# Patient Record
Sex: Female | Born: 1935 | Race: White | Hispanic: No | Marital: Married
Health system: Southern US, Community
[De-identification: ages and names within clinical notes are randomized; demographics above are authoritative.]

---

## 2005-02-27 ENCOUNTER — Ambulatory Visit: Payer: Self-pay | Admitting: Internal Medicine

## 2005-08-17 ENCOUNTER — Emergency Department: Payer: Self-pay | Admitting: Emergency Medicine

## 2005-08-21 ENCOUNTER — Ambulatory Visit: Payer: Self-pay | Admitting: Internal Medicine

## 2006-05-09 ENCOUNTER — Ambulatory Visit: Payer: Self-pay | Admitting: Internal Medicine

## 2007-07-24 ENCOUNTER — Ambulatory Visit: Payer: Self-pay | Admitting: Internal Medicine

## 2008-02-03 ENCOUNTER — Ambulatory Visit: Payer: Self-pay | Admitting: Gastroenterology

## 2008-07-27 ENCOUNTER — Ambulatory Visit: Payer: Self-pay | Admitting: Internal Medicine

## 2009-08-09 ENCOUNTER — Ambulatory Visit: Payer: Self-pay | Admitting: Internal Medicine

## 2010-08-22 ENCOUNTER — Ambulatory Visit: Payer: Self-pay | Admitting: Internal Medicine

## 2010-11-22 ENCOUNTER — Emergency Department: Payer: Self-pay | Admitting: Emergency Medicine

## 2011-07-23 ENCOUNTER — Emergency Department: Payer: Self-pay | Admitting: *Deleted

## 2011-07-23 LAB — CBC
HCT: 28.7 % — ABNORMAL LOW (ref 35.0–47.0)
Platelet: 354 10*3/uL (ref 150–440)
RDW: 13.9 % (ref 11.5–14.5)
WBC: 8.1 10*3/uL (ref 3.6–11.0)

## 2011-07-23 LAB — COMPREHENSIVE METABOLIC PANEL
Albumin: 3.5 g/dL (ref 3.4–5.0)
Anion Gap: 13 (ref 7–16)
BUN: 19 mg/dL — ABNORMAL HIGH (ref 7–18)
Bilirubin,Total: 0.5 mg/dL (ref 0.2–1.0)
Chloride: 100 mmol/L (ref 98–107)
Creatinine: 0.78 mg/dL (ref 0.60–1.30)
Osmolality: 283 (ref 275–301)
Potassium: 4.1 mmol/L (ref 3.5–5.1)
SGOT(AST): 33 U/L (ref 15–37)
Sodium: 141 mmol/L (ref 136–145)
Total Protein: 7.2 g/dL (ref 6.4–8.2)

## 2011-07-23 LAB — PROTIME-INR: INR: 0.9

## 2011-11-26 ENCOUNTER — Encounter: Payer: Self-pay | Admitting: Neurology

## 2013-01-07 ENCOUNTER — Emergency Department: Payer: Self-pay | Admitting: Emergency Medicine

## 2013-01-19 ENCOUNTER — Emergency Department: Payer: Self-pay | Admitting: Emergency Medicine

## 2013-02-13 ENCOUNTER — Inpatient Hospital Stay: Payer: Self-pay | Admitting: Internal Medicine

## 2013-02-13 LAB — COMPREHENSIVE METABOLIC PANEL
Albumin: 3.3 g/dL — ABNORMAL LOW (ref 3.4–5.0)
Alkaline Phosphatase: 136 U/L (ref 50–136)
Anion Gap: 5 — ABNORMAL LOW (ref 7–16)
BUN: 24 mg/dL — ABNORMAL HIGH (ref 7–18)
Chloride: 104 mmol/L (ref 98–107)
Creatinine: 0.79 mg/dL (ref 0.60–1.30)
EGFR (Non-African Amer.): >60
Osmolality: 283 (ref 275–301)
SGOT(AST): 24 U/L (ref 15–37)
SGPT (ALT): 12 U/L (ref 12–78)
Total Protein: 6.6 g/dL (ref 6.4–8.2)

## 2013-02-13 LAB — URINALYSIS, COMPLETE
Bacteria: NONE SEEN
Bilirubin,UR: NEGATIVE
Glucose,UR: 50 mg/dL (ref 0–75)
Leukocyte Esterase: NEGATIVE
Nitrite: NEGATIVE
Ph: 5 (ref 4.5–8.0)
Specific Gravity: 1.023 (ref 1.003–1.030)
Squamous Epithelial: NONE SEEN

## 2013-02-13 LAB — TROPONIN I: Troponin-I: <0.02 ng/mL

## 2013-02-13 LAB — CK TOTAL AND CKMB (NOT AT ARMC)
CK, Total: 160 U/L (ref 21–215)
CK-MB: 3.4 ng/mL (ref 0.5–3.6)

## 2013-02-13 LAB — CBC
MCH: 31.3 pg (ref 26.0–34.0)
MCV: 92 fL (ref 80–100)
Platelet: 240 10*3/uL (ref 150–440)
RDW: 14.2 % (ref 11.5–14.5)
WBC: 12.4 10*3/uL — ABNORMAL HIGH (ref 3.6–11.0)

## 2013-02-14 LAB — CBC WITH DIFFERENTIAL/PLATELET
Basophil #: 0 10*3/uL (ref 0.0–0.1)
Eosinophil %: 1.1 %
HCT: 26.4 % — ABNORMAL LOW (ref 35.0–47.0)
Lymphocyte #: 1.2 10*3/uL (ref 1.0–3.6)
MCH: 31.5 pg (ref 26.0–34.0)
MCHC: 34.7 g/dL (ref 32.0–36.0)
MCV: 91 fL (ref 80–100)
Neutrophil %: 76.8 %
RBC: 2.91 10*6/uL — ABNORMAL LOW (ref 3.80–5.20)
WBC: 10 10*3/uL (ref 3.6–11.0)

## 2013-02-14 LAB — BASIC METABOLIC PANEL
Anion Gap: 5 — ABNORMAL LOW (ref 7–16)
Calcium, Total: 8.5 mg/dL (ref 8.5–10.1)
Chloride: 105 mmol/L (ref 98–107)
Co2: 29 mmol/L (ref 21–32)
Potassium: 3.7 mmol/L (ref 3.5–5.1)
Sodium: 139 mmol/L (ref 136–145)

## 2013-02-15 LAB — BASIC METABOLIC PANEL
Calcium, Total: 7.9 mg/dL — ABNORMAL LOW (ref 8.5–10.1)
Co2: 28 mmol/L (ref 21–32)
EGFR (African American): >60
Glucose: 123 mg/dL — ABNORMAL HIGH (ref 65–99)
Potassium: 4.1 mmol/L (ref 3.5–5.1)
Sodium: 142 mmol/L (ref 136–145)

## 2013-02-15 LAB — CBC WITH DIFFERENTIAL/PLATELET
Basophil %: 0.4 %
Eosinophil #: 0.2 10*3/uL (ref 0.0–0.7)
Eosinophil %: 1.7 %
Lymphocyte %: 11.1 %
MCH: 31.8 pg (ref 26.0–34.0)
MCV: 90 fL (ref 80–100)
Neutrophil #: 6.9 10*3/uL — ABNORMAL HIGH (ref 1.4–6.5)
Neutrophil %: 77 %
Platelet: 159 10*3/uL (ref 150–440)
RBC: 2.35 10*6/uL — ABNORMAL LOW (ref 3.80–5.20)

## 2013-02-16 LAB — BASIC METABOLIC PANEL
Anion Gap: 5 — ABNORMAL LOW (ref 7–16)
BUN: 7 mg/dL (ref 7–18)
Calcium, Total: 8.8 mg/dL (ref 8.5–10.1)
Chloride: 107 mmol/L (ref 98–107)
Chloride: 106 mmol/L (ref 98–107)
Co2: 29 mmol/L (ref 21–32)
Creatinine: 0.51 mg/dL — ABNORMAL LOW (ref 0.60–1.30)
EGFR (African American): >60
EGFR (Non-African Amer.): >60
EGFR (Non-African Amer.): >60
Glucose: 118 mg/dL — ABNORMAL HIGH (ref 65–99)
Glucose: 177 mg/dL — ABNORMAL HIGH (ref 65–99)
Osmolality: 278 (ref 275–301)
Potassium: 3.9 mmol/L (ref 3.5–5.1)
Sodium: 140 mmol/L (ref 136–145)

## 2013-02-16 LAB — CBC WITH DIFFERENTIAL/PLATELET
Basophil %: 0.3 %
Eosinophil #: 0.1 10*3/uL (ref 0.0–0.7)
Eosinophil %: 1.3 %
HCT: 23.8 % — ABNORMAL LOW (ref 35.0–47.0)
Lymphocyte %: 10.7 %
MCHC: 34.4 g/dL (ref 32.0–36.0)
MCV: 91 fL (ref 80–100)
Monocyte #: 0.7 x10 3/mm (ref 0.2–0.9)
Monocyte %: 8.7 %
Neutrophil %: 79 %
Platelet: 173 10*3/uL (ref 150–440)
RBC: 2.62 10*6/uL — ABNORMAL LOW (ref 3.80–5.20)
WBC: 7.8 10*3/uL (ref 3.6–11.0)

## 2013-02-16 LAB — HEMOGLOBIN: HGB: 9.4 g/dL — ABNORMAL LOW (ref 12.0–16.0)

## 2013-02-17 LAB — CBC WITH DIFFERENTIAL/PLATELET
Eosinophil #: 0.2 10*3/uL (ref 0.0–0.7)
Lymphocyte %: 12.7 %
MCHC: 34.1 g/dL (ref 32.0–36.0)
MCV: 91 fL (ref 80–100)
Neutrophil #: 5.6 10*3/uL (ref 1.4–6.5)
Neutrophil %: 77 %
Platelet: 203 10*3/uL (ref 150–440)
RBC: 2.53 10*6/uL — ABNORMAL LOW (ref 3.80–5.20)
WBC: 7.2 10*3/uL (ref 3.6–11.0)

## 2013-02-18 LAB — BASIC METABOLIC PANEL
Calcium, Total: 8.5 mg/dL (ref 8.5–10.1)
Co2: 27 mmol/L (ref 21–32)
Glucose: 122 mg/dL — ABNORMAL HIGH (ref 65–99)
Osmolality: 279 (ref 275–301)
Potassium: 4.2 mmol/L (ref 3.5–5.1)
Sodium: 140 mmol/L (ref 136–145)

## 2013-02-18 LAB — CBC WITH DIFFERENTIAL/PLATELET
Basophil #: 0 10*3/uL (ref 0.0–0.1)
Eosinophil #: 0.2 10*3/uL (ref 0.0–0.7)
Eosinophil %: 3.1 %
HCT: 28.2 % — ABNORMAL LOW (ref 35.0–47.0)
HGB: 9.8 g/dL — ABNORMAL LOW (ref 12.0–16.0)
Lymphocyte #: 0.9 10*3/uL — ABNORMAL LOW (ref 1.0–3.6)
Lymphocyte %: 13.9 %
Monocyte %: 10 %
Neutrophil #: 4.9 10*3/uL (ref 1.4–6.5)
Neutrophil %: 72.6 %
RBC: 3.13 10*6/uL — ABNORMAL LOW (ref 3.80–5.20)

## 2014-02-23 IMAGING — CT CT OF THE RIGHT HIP WITHOUT CONTRAST
1 of 2 series · 14 of 32 positions shown, 20 images · non-contrast
Comparison: none

REASON FOR EXAM: evaluate hip fracture
COMMENTS:

PROCEDURE:     CT  - CT HIP RIGHT WITHOUT CONTRAST  - February 13, 2013  [DATE]
RESULT:
TECHNIQUE: Multiplanar imaging of the right hip was obtained utilizing
helical 3 mm acquisition and bone reconstruction algorithm.

[Series 2: hip 3.0 b70s · axial · 0.35mm/px · z∈[-618,-471]mm · 14 of 57 slices shown, 20 images]
[im 4/57  soft-tissue]
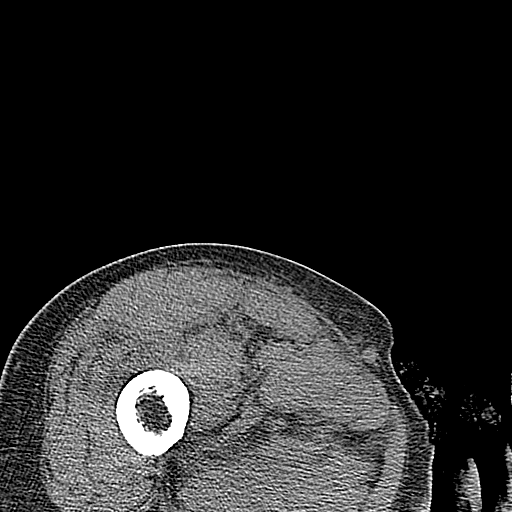
[im 4/57  bone]
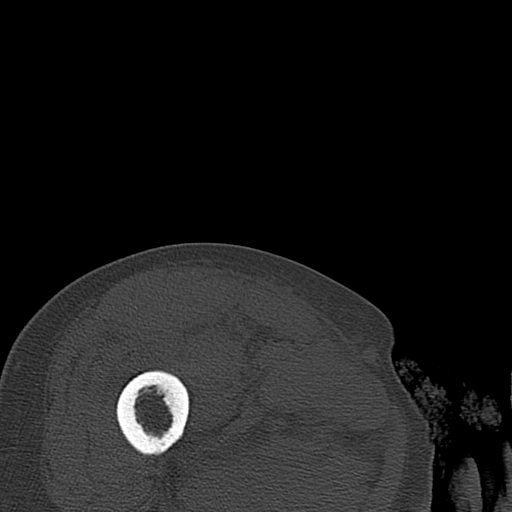
[im 8/57  soft-tissue]
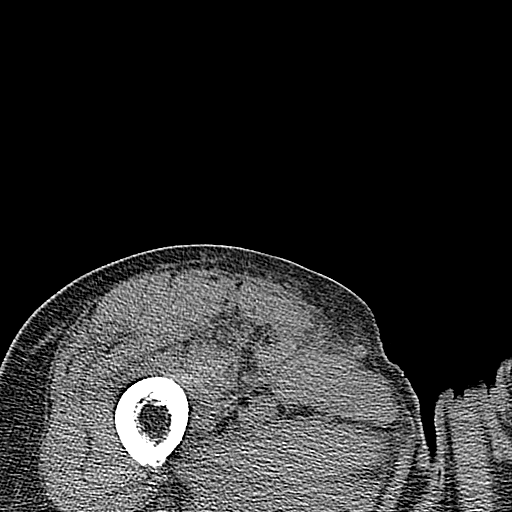
[im 12/57  soft-tissue]
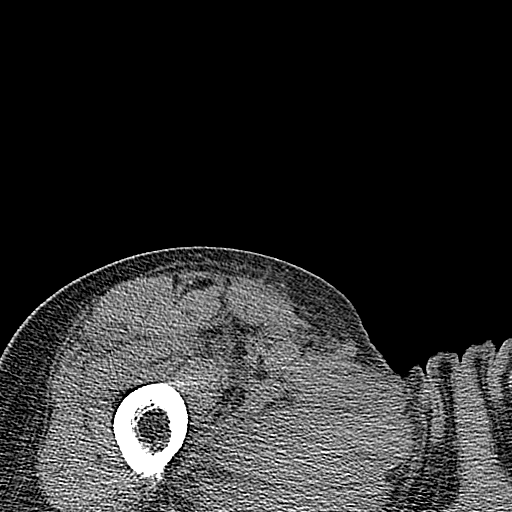
[im 15/57  soft-tissue]
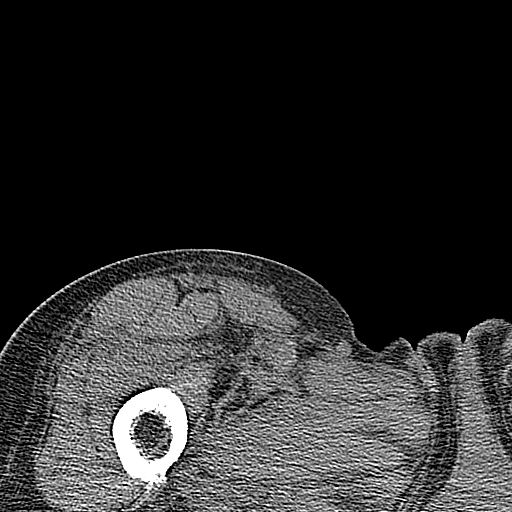
[im 19/57  soft-tissue]
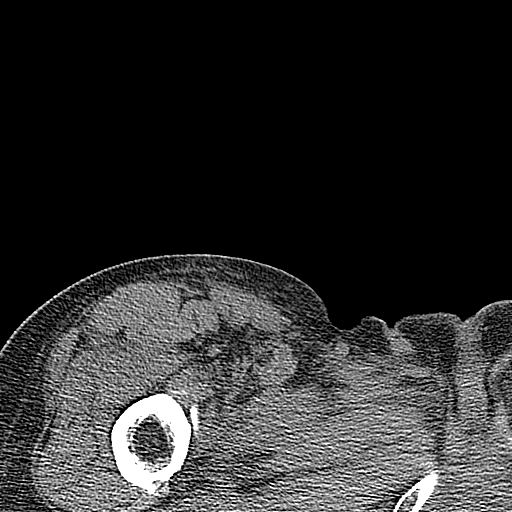
[im 23/57  soft-tissue]
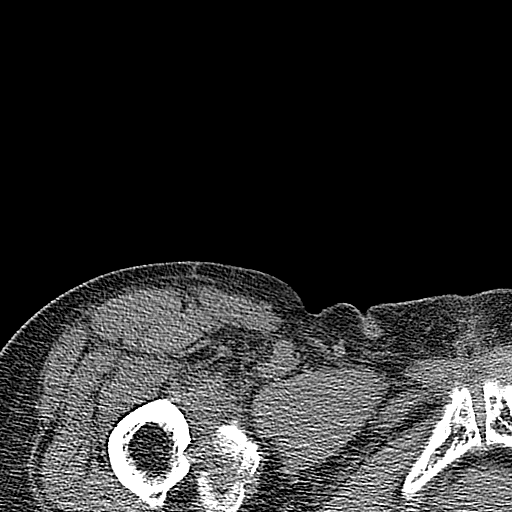
[im 27/57  soft-tissue]
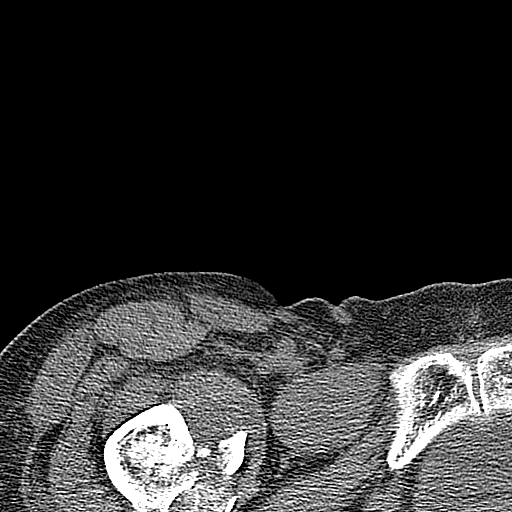
[im 30/57  soft-tissue]
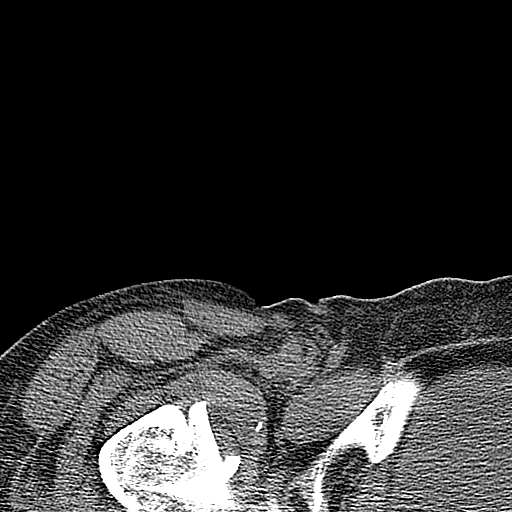
[im 34/57  soft-tissue]
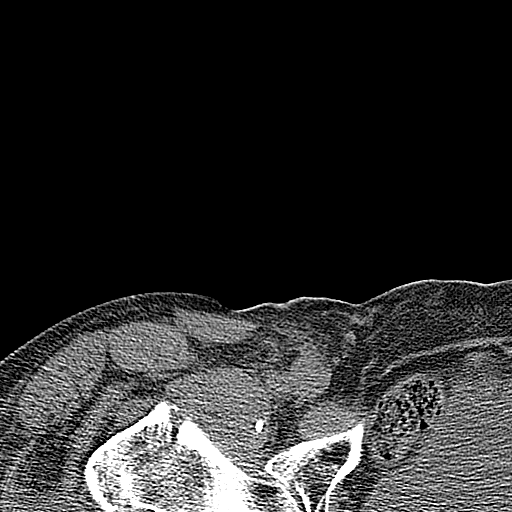
[im 34/57  bone]
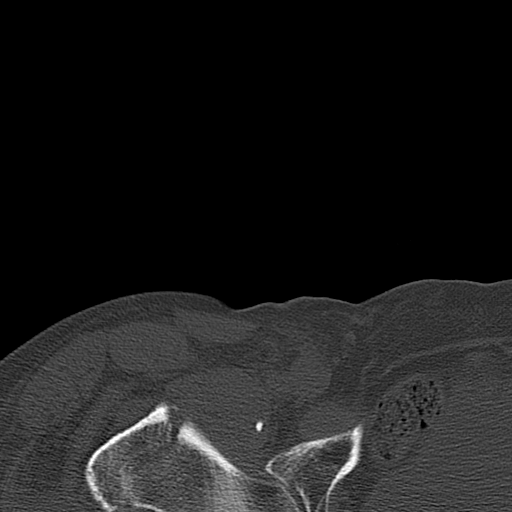
[im 38/57  soft-tissue]
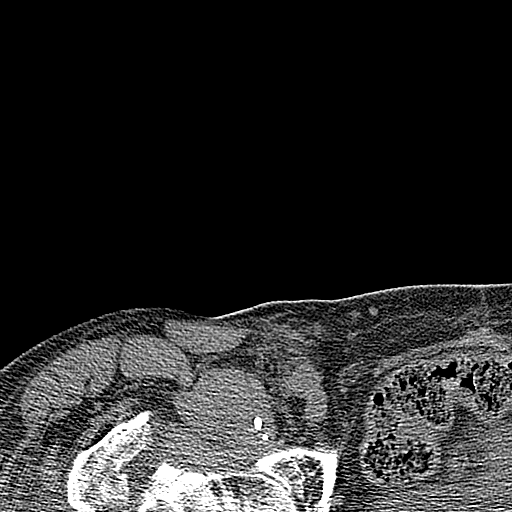
[im 42/57  soft-tissue]
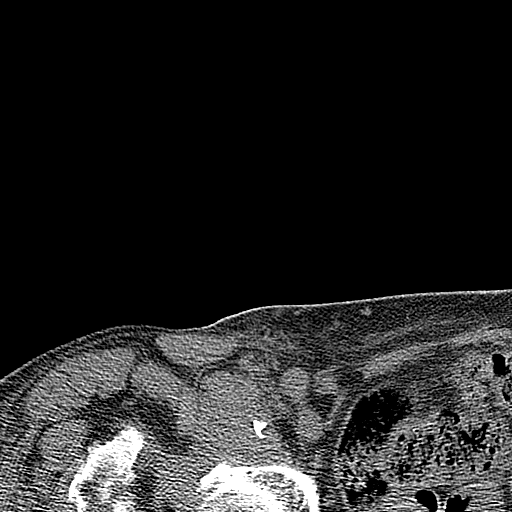
[im 42/57  lung]
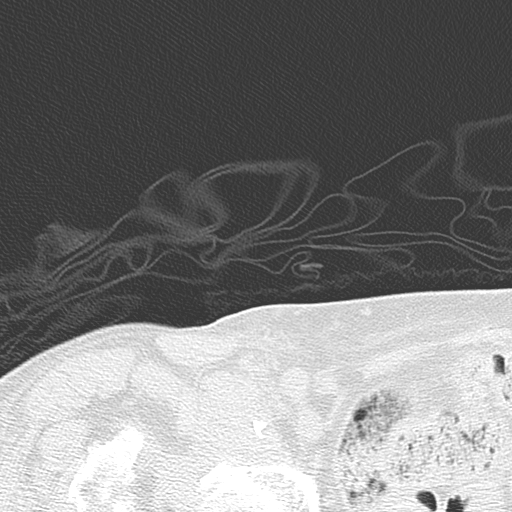
[im 45/57  soft-tissue]
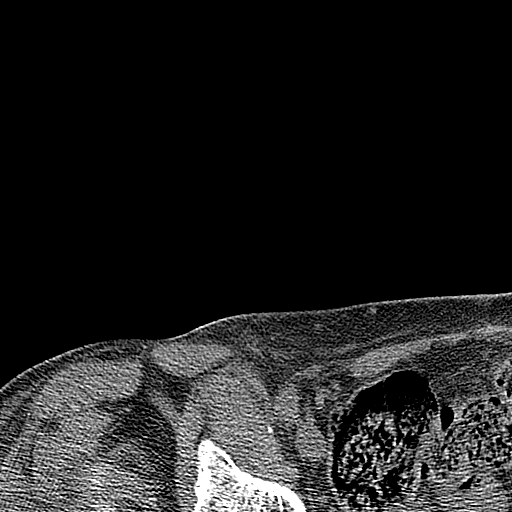
[im 45/57  lung]
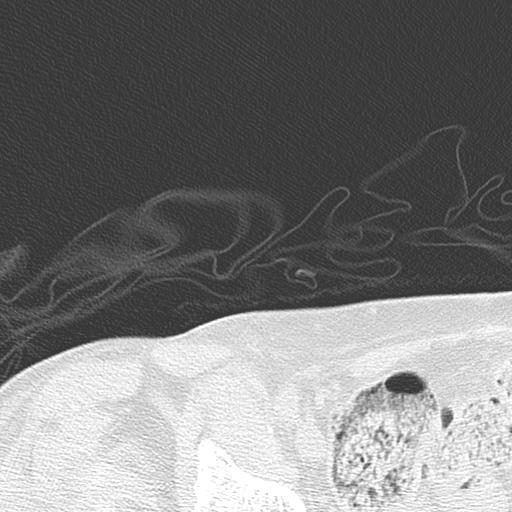
[im 49/57  soft-tissue]
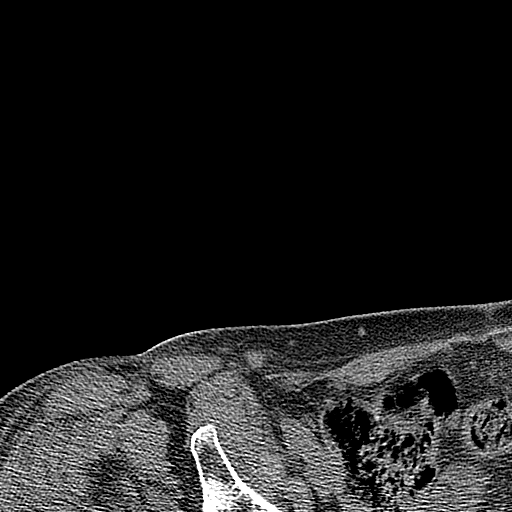
[im 49/57  lung]
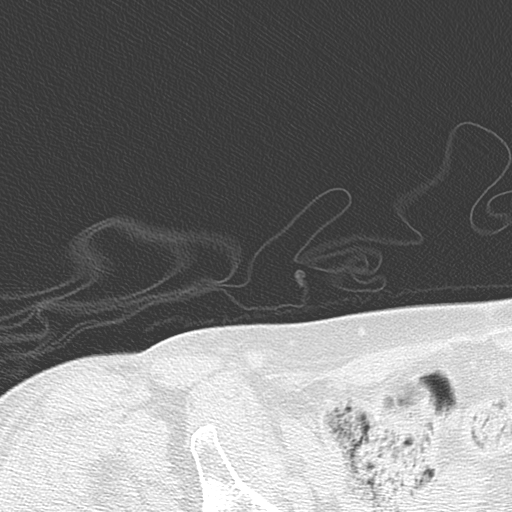
[im 53/57  soft-tissue]
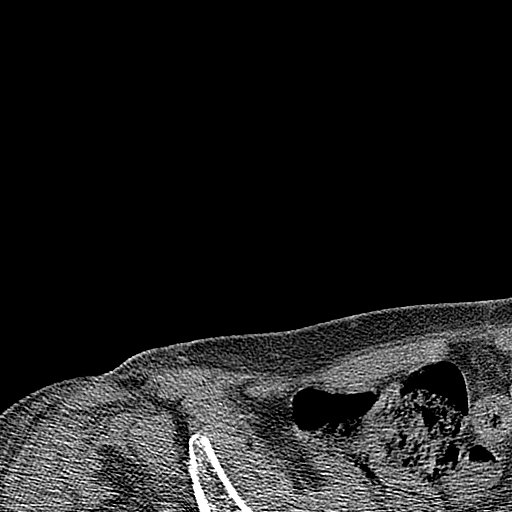
[im 53/57  lung]
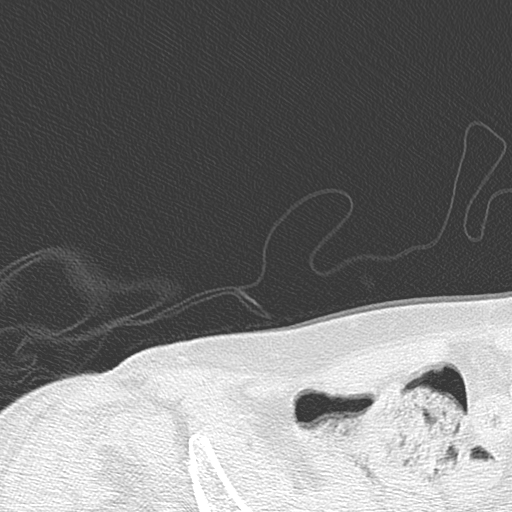

[14 of 32 positions shown; findings below may reference images not displayed]

FINDINGS: A comminuted fracture is identified along the base of the femoral
neck with extension into the intertrochanteric region and a concomitant,
comminuted intertrochanteric fracture. There is superior displacement and
impaction of the fracture. The femoral head is located.

A small hematoma is identified about the joint. Small butterfly fragments
project just anterior to the anterior/superior aspect of the acetabulum.
IMPRESSION: Comminuted right hip fracture involving the base of the
femoral neck and intertrochanteric region.

## 2014-02-23 IMAGING — CR RIGHT HIP - COMPLETE 2+ VIEW
1 series · 2 of 2 positions shown · non-contrast
Comparison: none

REASON FOR EXAM: mobile xray showed right hip fx
COMMENTS:

PROCEDURE:     DXR - DXR HIP RIGHT COMPLETE  - February 13, 2013  [DATE]
RESULT:

[Series 1: t hip ap right · 0.14mm/px · 2 of 2 slices shown]
[im 1/2]
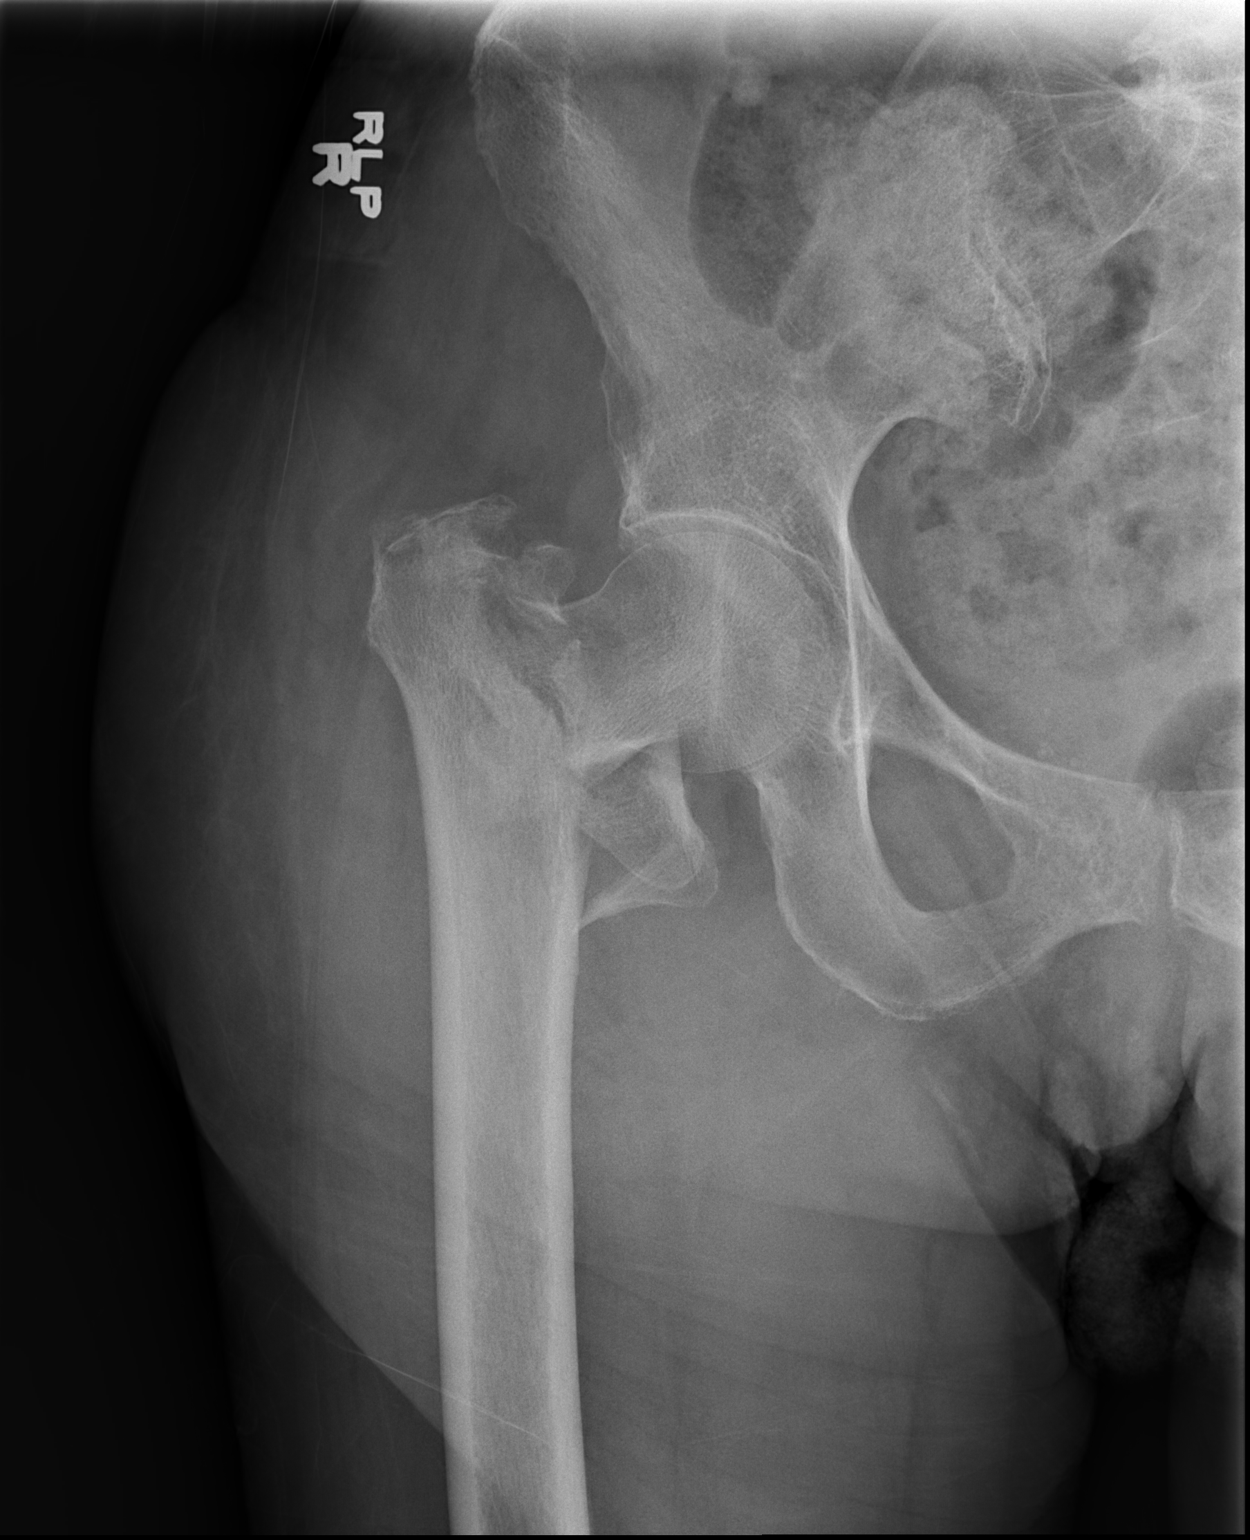
[im 2/2]
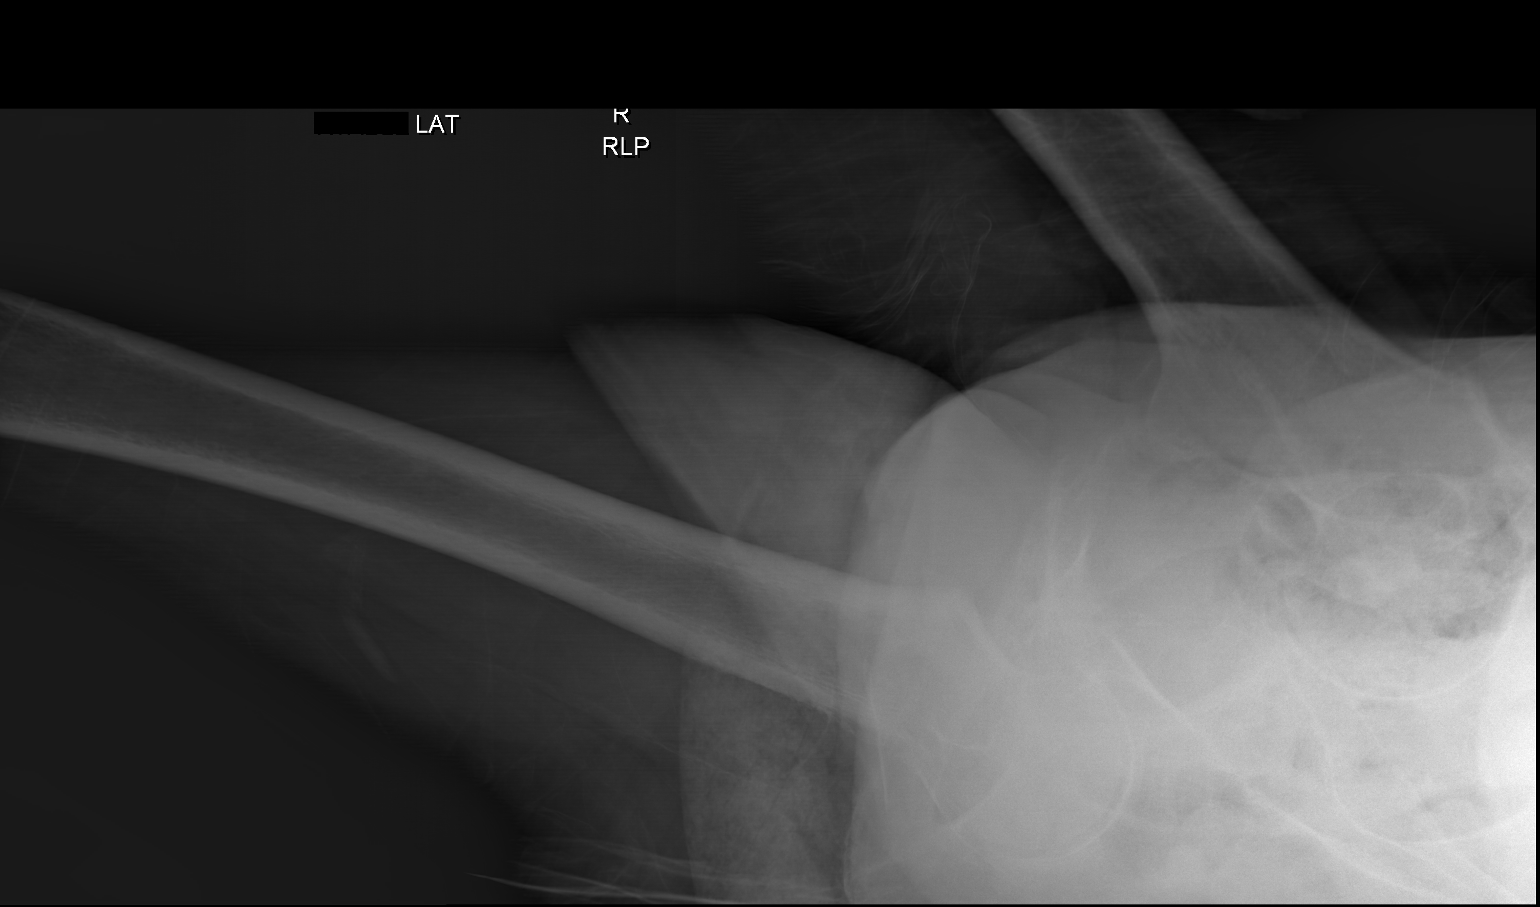

[2 of 2 positions shown; findings below may reference images not displayed]

FINDINGS: An impacted comminuted fracture is identified along the base of
the femoral neck. There is extension into the intertrochanteric region with
a concomitant, comminuted intertrochanteric component. The femoral head is
located.
IMPRESSION: Femoral neck fracture with extension into the
intertrochanteric region.

## 2014-07-16 DIAGNOSIS — R32 Unspecified urinary incontinence: Secondary | ICD-10-CM | POA: Diagnosis not present

## 2014-08-16 DIAGNOSIS — R32 Unspecified urinary incontinence: Secondary | ICD-10-CM | POA: Diagnosis not present

## 2014-08-18 DIAGNOSIS — M159 Polyosteoarthritis, unspecified: Secondary | ICD-10-CM | POA: Diagnosis not present

## 2014-08-18 DIAGNOSIS — G20 Parkinson's disease: Secondary | ICD-10-CM | POA: Diagnosis not present

## 2014-08-18 DIAGNOSIS — I10 Essential (primary) hypertension: Secondary | ICD-10-CM | POA: Diagnosis not present

## 2014-09-14 DIAGNOSIS — R32 Unspecified urinary incontinence: Secondary | ICD-10-CM | POA: Diagnosis not present

## 2014-10-15 DIAGNOSIS — R32 Unspecified urinary incontinence: Secondary | ICD-10-CM | POA: Diagnosis not present

## 2014-11-05 NOTE — Discharge Summary (Signed)
PATIENT NAME:  Megan Browning, Megan Browning MR#:  [id number] DATE OF BIRTH:  [birth year]  DATE OF ADMISSION:  02/13/2013 DATE OF DISCHARGE:  02/18/2013  DIAGNOSES AT TIME OF DISCHARGE: 1.  Right intertrochanteric femoral neck fracture.  2.  Dementia.  3.  Parkinson's disease.  4.  Hypertension.  5.  Anemia.  6.  Osteoporosis.  7.  Weakness.   CHIEF COMPLAINT: Right femoral neck fracture.   Megan NajjarBetty Browning is a 81 year old female with a history of dementia who was unable to answer questions who [facility] had [facility] and sustained a fracture of the femoral neck with extension to the intertrochanteric area. The patient reportedly had not been complaining of pain in the area, but had fallen on a few occasions at the facility.   PAST MEDICAL HISTORY: Significant for dementia, Parkinson's disease, hypertension.   PAST SURGICAL HISTORY: Significant for hysterectomy.   PHYSICAL EXAMINATION: VITAL SIGNS: Temp was 98.2, pulse was 83, respirations 18, blood pressure 137/65, pulse ox 96% on room air. She did not appear to be in respiratory distress. Pupils are equal and reactive to light.  NECK: No JVD.  LUNGS: Clear to auscultation.  HEART: S1, S2.  ABDOMEN: Soft, nontender.  EXTREMITIES: No edema. The patient was alert, but unable to answer any questions. Unable to give a history. Cranial nerves were intact.   LABS: INR was 0.9. Troponin was negative.   WBC count 12.4, hemoglobin 10.3, hematocrit 30.1, platelets 240, glucose 166, BUN 24, creatinine 0.79, sodium 138, potassium 4.0, chloride 104, CO2 29, calcium 8.8.   Liver function tests were normal.   X-ray of the pelvis showed a comminuted fracture in the base of the femoral neck and also lesser trochanter on the right side. There was superior displacement, with impaction and medial angulation.   Chest x-ray showed prominent interstitial markings, but this was likely due to her shallow inspiration.    The patient was seen in consultation with Dr. Martha ClanKrasinski and underwent intramedullary rod fixation of the right basicervical hip fracture on the 2nd of August.   Postoperatively the patient did develop some anemia for which she received a total of 2 units of blood transfusion. She continued to be somewhat confused, and received clonazepam p.Browning.n. Her blood pressure medicines were held for a couple of days and subsequently restarted at a lower dose. She was also seen by physical therapy and was stable at the time of discharge.   MEDICATIONS ON DISCHARGE: Metoprolol succinate, extended-release 100 mg once a day, Cozaar 50 mg once a day, acetaminophen 325, 2 tablets every 4 to 6 hours p.Browning.n. for temperature greater than 100.5, ferrous sulfate 325 mg b.i.d. p.Browning.n., zolpidem 5 mg at bedtime p.Browning.n., aspirin 81 mg a day, Fosamax 70 mg q. weekly, polyethylene glycol powder once a day as needed for constipation, carbidopa/levodopa 50/200, 1 tablet in the morning, 1 tablet in the afternoon, and one-half tablet once a day at night, melatonin 5 mg at bedtime, simvastatin 40 mg a day, amlodipine 5 mg a day, clonazepam 1.5 mg once a day as needed for anxiety and nervousness.   The patient will follow-up with me, Dr. Marcello FennelHande, and also follow up with Dr. Martha ClanKrasinski in 1 to  2 weeks.   The patient was stable at time of transfer.   Total time spent in discharge of this pt: 35  minutes   ____________________________ Barbette ReichmannVishwanath Dauna Ziska, MD vh:dm D: 02/18/2013 08:32:31 ET T: 02/18/2013 08:45:32 ET JOB#: [id number]  cc: Barbette Reichmann, MD, <Dictator> Barbette Reichmann MD ELECTRONICALLY SIGNED 02/23/2013 18:42

## 2014-11-05 NOTE — Op Note (Signed)
PATIENT NAME:  Megan Browning, Megan Browning MR#:  [id number] DATE OF BIRTH:  Jul 10, 1934  DATE OF PROCEDURE:  02/14/2013  PREOPERATIVE DIAGNOSIS: Right comminuted intertrochanteric hip fracture.   POSTOPERATIVE DIAGNOSIS: Right comminuted basicervical neck fracture, right hip.   PROCEDURE: Intramedullary rod fixation of right basicervical hip fracture.   SURGEON: [place], M.D.   ANESTHESIA: Spinal.   ESTIMATED BLOOD LOSS: 100 mL.   COMPLICATIONS: None.   IMPLANTS: [employer] Affixus 11 x 380 mm intramedullary nail with an 85 mm lag screw.   INDICATIONS FOR PROCEDURE: The patient is a 81 year old female with dementia and Parkinson's who was admitted last night after complaining of right hip pain. The patient's husband and son were with her upon admission. The patient was able to provide accurate history given her dementia. The family states that she has had multiple falls recently and it is unclear exactly when this hip fracture may have occurred. I recommended to them that we perform intramedullary fixation to allow the patient to be cared for at her nursing facility and to help reduce any pain that she may have. The patient is not ambulating much at baseline. I reviewed the risks and benefits of surgery with them. They understand the risks include infection, bleeding requiring blood transfusion, nerve or blood vessel injury, malunion, nonunion, hardware failure or screw cutout, leg length discrepancy, change in lower extremity rotation, persistent right hip pain and the need for further surgery. Medical complications include, but are not limited to, DVT and pulmonary embolism, myocardial infarction, stroke, pneumonia, respiratory failure and death. The patient was cleared by the hospitalist service for surgery. I reviewed all labs and radiographic studies prior to surgery.   PROCEDURE NOTE: The patient was marked with the word "yes" over the right hip according to the hospital's right site protocol. She  was brought to the Operating Room where she underwent a spinal anesthetic by the anesthesia service. She was then positioned on a fracture table. The patient has baseline contractures at the knee and hip as well as increased muscle tone due to Parkinson's disease. Her right leg was placed carefully in a leg holder with a central post for traction. Her left hip and knee were flexed approximately 90 degrees. She did not have much external rotation. She was adequately padded to protect all bony prominences. A timeout was performed to verify the patient's name, date of birth, medical record number, correct site of surgery and correct procedure to be performed. It was also used to verify the patient had received antibiotics and that all appropriate instruments, implants and radiographic studies were available in the room. Once all in attendance were in agreement, the case began. Traction was applied to the right leg along with slight internal rotation. The fracture reduction was then confirmed on AP and lateral C-arm images. Once the reduction was acceptable, the patient was prepped and draped in a sterile fashion. An incision with a #10 blade was made superior to the tip of the greater trochanter. The fascia lata was then incised with a deep #10 blade. The hip abductors were split in line with the fibers to allow for palpation of the tip of the greater trochanter. A guide pin was then advanced into the medial aspect of the tip of the greater trochanter and advanced into the femoral canal. The position of the guidepin was confirmed on AP and lateral C-arm images. A starting awl was then used to create the proximal opening into the greater trochanter. Then, a long ball-tip guidewire  was placed into the femoral canal. Its position was confirmed both at the hip and the knee with AP and lateral C-arm images. Sequential flexible reamers were then used to dilate the intramedullary canal to allow for placement of an 11 mm long  intramedullary rod. The final reamer was 13 mm in diameter. A depth gauge was used to confirm the length of the rod. It was felt that a 380 mm rod would be most appropriate for this patient. An 11 x 380 mm rod was then assembled onto the Bi[employer] Affixus guide arm. It was then placed over the ball-tip guidewire and into position in the femur. The position of the rod was confirmed on AP and lateral images. A guide sleeve for the drill bit to drill for a lag screw was then placed alongside the lateral femur approximately 8 to 10 cm below the initial incision. A second skin incision was made with a #10 blade, and the fascia lata again was incised in line with its fibers to allow for passage of this guide sleeve to approximate the lateral cortex of the femur. The drill pin was then advanced across the fracture site and into the femoral head. Its position was confirmed on AP and lateral C-arm images. A tip-apex distance of less than 25 mm was achieved. The depth of the lag screw was then measured off the guidepin. It was felt that an 85 mm lag screw would be most appropriate. The lag screw drill was then passed over the guidewire to a depth of 85 mm and then withdrawn. An 85 mm lag screw was then advanced by hand across the fracture site and into the femoral head. The patient had osteoporotic bone and it was felt that an 85 mm length was appropriate, any longer and I was concerned about possible femoral head collapse or screw penetration into the joint.   The set screw was then tightened at the proximal portion of the intramedullary rod. A quarter turn counterclockwise was then performed to allow for compression at the fracture site. The guide arm for the nail was then removed. An attempt was made to place a distal interlocking screw at the tip of the long nail, but given the patient's contractures, a perfect circle technique could not be achieved. The decision was then made to forego placement of a distal interlocking  screw, as the fracture was stable proximally in rotation. The 2 proximal incisions were then copiously irrigated. The fascia lata at the proximal incision was closed with a #1 Vicryl. The subcutaneous tissues were closed with 2-0 Vicryl and the skin approximated with staples. Dry sterile dressings were applied. The patient was then transferred to a hospital bed in stable condition. She had tolerated anesthesia. She was then brought to the PACU in stable condition. I was scrubbed and present for the entire case, and all sharp and instrument counts were correct at the conclusion of the case. I also met with the patient's family in the surgical waiting area to let them know the case had gone without complication and the patient was stable in the recovery room.    ____________________________ Timoteo Gaul, MD [id number] D: 02/14/2013 12:52:52 ET T: 02/14/2013 15:05:40 ET JOB#: [id number]  cc: Timoteo Gaul, MD, <Dictator> Timoteo Gaul MD ELECTRONICALLY SIGNED 02/26/2013 16:56

## 2014-11-05 NOTE — H&P (Signed)
PATIENT NAME:  Megan Browning, Megan Browning MR#:  [id number] DATE OF BIRTH:  06-27-1934  DATE OF ADMISSION:  02/13/2013  PRIMARY CARE PHYSICIAN:  Dr. Marcello Fennel  CHIEF COMPLAINT:  Brought in for hip fracture.   HISTORY OF PRESENT ILLNESS:  This is a 81 year old female unable to give any history. She does have dementia. She does answer yes or no questions, but cannot tell me what happened. As per the family, she does walk a little bit with assistance with the staff at [facility]. They sent her in today, and was found to have a femoral neck fracture, extension into the intertrochanteric region on the right. As per the family, the patient is not even complaining of any pain over there.    PAST MEDICAL HISTORY:  Dementia, Parkinson's, hypertension.   PAST SURGICAL HISTORY:  Hysterectomy.   ALLERGIES:  No known drug allergies.   MEDICATIONS INCLUDE:  Amlodipine 5 mg daily, aspirin 81 mg daily, carbidopa/levodopa 500/200 extended release 1 tablet in the morning, afternoon and 1/2 tablet at night, clonazepam 1.5 mg p.r.n., Fosamax 70 mg on Wednesday, losartan 100 mg daily, melatonin 5 mg at bedtime, metoprolol ER 200 mg daily, MiraLAX 17 grams as needed for constipation, Zocor 40 mg daily, Ambien 5 mg at bedtime.   SOCIAL HISTORY:  Is at [facility] memory unit. No smoking. No alcohol. No drug use. Used to work as a Futures trader, also a Ambulance person.   FAMILY HISTORY:  Father died at 24 of a CVA, mother died in her 22s. probable heart.  REVIEW OF SYSTEMS:   CONSTITUTIONAL:  No fever, chills, or sweats. No weight loss. Positive for weakness.  EYES:  Does wear glasses.  EARS, NOSE, MOUTH AND THROAT: No hearing loss. No sore throat. No difficulty swallowing.  CARDIOVASCULAR:  No chest pain. No palpitations.  RESPIRATORY:  No shortness of breath.  GASTROINTESTINAL:  No nausea. No vomiting. No abdominal pain. No diarrhea. Positive for constipation.  GENITOURINARY:  No burning on urination. No hematuria.  MUSCULOSKELETAL:   Positive for right hip soreness.  INTEGUMENT:  No rashes or eruptions.  NEUROLOGIC:  No fainting or blackouts.  PSYCHIATRIC:  No anxiety or depression.  ENDOCRINE:  No thyroid problems.  HEMATOLOGIC/LYMPHATIC:  No anemia.   PHYSICAL EXAMINATION: VITAL SIGNS: Temperature 98.2, pulse 83, respirations 18, blood pressure 137/65, pulse ox 96% on room air.  GENERAL:  No respiratory distress.  EYES:  Conjunctivae and lids normal. Pupils equal, round and reactive to light. Extraocular muscles intact. No nystagmus.  EARS, NOSE, MOUTH AND THROAT:  Tympanic membranes:  No erythema. Nasal mucosa: No erythema. Throat:  No erythema, no exudate seen. Lips and gums:  No lesions.  NECK: No JVD. No bruits. No lymphadenopathy. No thyromegaly. No thyroid nodules palpated.  RESPIRATORY:  Lungs clear to auscultation. No use of accessory muscles to breathe. No rhonchi, rales or wheeze heard.  CARDIOVASCULAR SYSTEM:  S1, S2 normal. No gallops, rubs or murmurs heard. Carotid upstroke 2+ bilaterally. No bruits.  EXTREMITIES:  Dorsalis pedis pulses 2+, bilateral lower extremities. No edema of the lower extremities.  ABDOMEN:  Soft, nontender. No organomegaly/splenomegaly. Normoactive bowel sounds. No masses felt.  LYMPHATIC:  No lymph nodes in the neck.  MUSCULOSKELETAL:  Right leg shortened and externally rotated. No clubbing, edema or cyanosis.  SKIN:  No rashes or ulcers seen.  NEUROLOGIC:  Cranial nerves II through XII grossly intact. Deep tendon reflexes 2+ bilateral lower extremity.  PSYCHIATRIC:  The patient is alert, answers some yes or no  questions, but unable to give any history.   LABORATORY AND RADIOLOGICAL DATA:  INR 0.9. Troponin negative. White blood cell count 12.4, H and H 10.3 and 30.1, platelet count of 240. Glucose 166, BUN 24, creatinine 0.79, sodium 138, potassium 4.0, chloride 104, CO2 of 29, calcium 8.8. Liver function tests normal range. Right hip fracture on x-ray. Chest x-ray:  Shallow  inspiration. CT hip:  Right hip fracture. Urinalysis:  1+ blood, 50 mg/dL of glucose.   ASSESSMENT AND PLAN: 1.  Preop for hip fracture. No contraindications to surgery at this time. The patient is a moderate risk for surgery. The patient is unable to give history but, as per the family, has been falling a lot. I will do no further cardiac testing at this point. The patient is already on metoprolol.   2.  Hypertension. I will hold Norvasc at this time. Continue metoprolol and losartan.   3.  Parkinson's. Continue Sinemet.   4.  Anemia. Will continue to monitor. Can have blood loss with the surgery.   5.  Impaired fasting glucose. Will check a hemoglobin A1c.    6.  Leukocytosis, likely reactive. Will make sure that the urine is negative, and it looks like it is.   Time spent on admission:  50 minutes.   The patient is a FULL CODE.   Case discussed with Dr. Martha ClanKrasinski,  Surgery.    ____________________________ Herschell Dimesichard J. Renae GlossWieting, MD rjw:mr D: 02/13/2013 20:37:00 ET T: 02/13/2013 20:48:57 ET JOB#: [id number]  cc: Barbette ReichmannVishwanath Hande, MD Herschell Dimesichard J. Renae GlossWieting, MD, <Dictator>    Salley ScarletICHARD J Damesha Lawler MD ELECTRONICALLY SIGNED 02/27/2013 16:33

## 2014-11-14 DIAGNOSIS — R32 Unspecified urinary incontinence: Secondary | ICD-10-CM | POA: Diagnosis not present

## 2014-12-15 DIAGNOSIS — R32 Unspecified urinary incontinence: Secondary | ICD-10-CM | POA: Diagnosis not present

## 2015-01-14 DIAGNOSIS — R32 Unspecified urinary incontinence: Secondary | ICD-10-CM | POA: Diagnosis not present

## 2015-02-14 DIAGNOSIS — R32 Unspecified urinary incontinence: Secondary | ICD-10-CM | POA: Diagnosis not present

## 2015-03-25 DIAGNOSIS — I10 Essential (primary) hypertension: Secondary | ICD-10-CM | POA: Diagnosis not present

## 2015-03-25 DIAGNOSIS — G20 Parkinson's disease: Secondary | ICD-10-CM | POA: Diagnosis not present

## 2015-03-25 DIAGNOSIS — M159 Polyosteoarthritis, unspecified: Secondary | ICD-10-CM | POA: Diagnosis not present

## 2015-04-19 DIAGNOSIS — G20 Parkinson's disease: Secondary | ICD-10-CM | POA: Diagnosis not present

## 2015-04-20 DIAGNOSIS — G20 Parkinson's disease: Secondary | ICD-10-CM | POA: Diagnosis not present

## 2015-04-21 DIAGNOSIS — G20 Parkinson's disease: Secondary | ICD-10-CM | POA: Diagnosis not present

## 2015-04-22 DIAGNOSIS — G20 Parkinson's disease: Secondary | ICD-10-CM | POA: Diagnosis not present

## 2015-04-23 DIAGNOSIS — G20 Parkinson's disease: Secondary | ICD-10-CM | POA: Diagnosis not present

## 2015-04-24 DIAGNOSIS — G20 Parkinson's disease: Secondary | ICD-10-CM | POA: Diagnosis not present

## 2015-04-25 DIAGNOSIS — G20 Parkinson's disease: Secondary | ICD-10-CM | POA: Diagnosis not present

## 2015-04-26 DIAGNOSIS — G20 Parkinson's disease: Secondary | ICD-10-CM | POA: Diagnosis not present

## 2015-04-27 DIAGNOSIS — G20 Parkinson's disease: Secondary | ICD-10-CM | POA: Diagnosis not present

## 2015-04-28 DIAGNOSIS — G20 Parkinson's disease: Secondary | ICD-10-CM | POA: Diagnosis not present

## 2015-04-29 DIAGNOSIS — G20 Parkinson's disease: Secondary | ICD-10-CM | POA: Diagnosis not present

## 2015-04-30 DIAGNOSIS — G20 Parkinson's disease: Secondary | ICD-10-CM | POA: Diagnosis not present

## 2015-05-01 DIAGNOSIS — G20 Parkinson's disease: Secondary | ICD-10-CM | POA: Diagnosis not present

## 2015-05-02 DIAGNOSIS — G20 Parkinson's disease: Secondary | ICD-10-CM | POA: Diagnosis not present

## 2015-05-03 DIAGNOSIS — G20 Parkinson's disease: Secondary | ICD-10-CM | POA: Diagnosis not present

## 2015-05-04 DIAGNOSIS — G20 Parkinson's disease: Secondary | ICD-10-CM | POA: Diagnosis not present

## 2015-05-05 DIAGNOSIS — G20 Parkinson's disease: Secondary | ICD-10-CM | POA: Diagnosis not present

## 2015-05-06 DIAGNOSIS — G20 Parkinson's disease: Secondary | ICD-10-CM | POA: Diagnosis not present

## 2015-05-07 DIAGNOSIS — G20 Parkinson's disease: Secondary | ICD-10-CM | POA: Diagnosis not present

## 2015-05-08 DIAGNOSIS — G20 Parkinson's disease: Secondary | ICD-10-CM | POA: Diagnosis not present

## 2015-05-09 DIAGNOSIS — G20 Parkinson's disease: Secondary | ICD-10-CM | POA: Diagnosis not present

## 2015-05-10 DIAGNOSIS — G20 Parkinson's disease: Secondary | ICD-10-CM | POA: Diagnosis not present

## 2015-05-11 DIAGNOSIS — Z23 Encounter for immunization: Secondary | ICD-10-CM | POA: Diagnosis not present

## 2015-05-11 DIAGNOSIS — G20 Parkinson's disease: Secondary | ICD-10-CM | POA: Diagnosis not present

## 2015-05-12 DIAGNOSIS — G20 Parkinson's disease: Secondary | ICD-10-CM | POA: Diagnosis not present

## 2015-05-13 DIAGNOSIS — G20 Parkinson's disease: Secondary | ICD-10-CM | POA: Diagnosis not present

## 2015-05-14 DIAGNOSIS — G20 Parkinson's disease: Secondary | ICD-10-CM | POA: Diagnosis not present

## 2015-05-15 DIAGNOSIS — G20 Parkinson's disease: Secondary | ICD-10-CM | POA: Diagnosis not present

## 2015-05-16 DIAGNOSIS — G20 Parkinson's disease: Secondary | ICD-10-CM | POA: Diagnosis not present

## 2015-05-17 DIAGNOSIS — G20 Parkinson's disease: Secondary | ICD-10-CM | POA: Diagnosis not present

## 2015-05-17 DIAGNOSIS — R32 Unspecified urinary incontinence: Secondary | ICD-10-CM | POA: Diagnosis not present

## 2015-05-18 DIAGNOSIS — G20 Parkinson's disease: Secondary | ICD-10-CM | POA: Diagnosis not present

## 2015-05-19 DIAGNOSIS — G20 Parkinson's disease: Secondary | ICD-10-CM | POA: Diagnosis not present

## 2015-05-20 DIAGNOSIS — G20 Parkinson's disease: Secondary | ICD-10-CM | POA: Diagnosis not present

## 2015-05-21 DIAGNOSIS — G20 Parkinson's disease: Secondary | ICD-10-CM | POA: Diagnosis not present

## 2015-05-22 DIAGNOSIS — G20 Parkinson's disease: Secondary | ICD-10-CM | POA: Diagnosis not present

## 2015-05-23 DIAGNOSIS — G20 Parkinson's disease: Secondary | ICD-10-CM | POA: Diagnosis not present

## 2015-05-24 DIAGNOSIS — G20 Parkinson's disease: Secondary | ICD-10-CM | POA: Diagnosis not present

## 2015-05-25 DIAGNOSIS — G20 Parkinson's disease: Secondary | ICD-10-CM | POA: Diagnosis not present

## 2015-05-26 DIAGNOSIS — G20 Parkinson's disease: Secondary | ICD-10-CM | POA: Diagnosis not present

## 2015-05-27 DIAGNOSIS — G20 Parkinson's disease: Secondary | ICD-10-CM | POA: Diagnosis not present

## 2015-05-28 DIAGNOSIS — G20 Parkinson's disease: Secondary | ICD-10-CM | POA: Diagnosis not present

## 2015-05-29 DIAGNOSIS — G20 Parkinson's disease: Secondary | ICD-10-CM | POA: Diagnosis not present

## 2015-05-30 DIAGNOSIS — G20 Parkinson's disease: Secondary | ICD-10-CM | POA: Diagnosis not present

## 2015-05-31 DIAGNOSIS — G20 Parkinson's disease: Secondary | ICD-10-CM | POA: Diagnosis not present

## 2015-06-01 DIAGNOSIS — G20 Parkinson's disease: Secondary | ICD-10-CM | POA: Diagnosis not present

## 2015-06-02 DIAGNOSIS — G20 Parkinson's disease: Secondary | ICD-10-CM | POA: Diagnosis not present

## 2015-06-03 DIAGNOSIS — G20 Parkinson's disease: Secondary | ICD-10-CM | POA: Diagnosis not present

## 2015-06-04 DIAGNOSIS — G20 Parkinson's disease: Secondary | ICD-10-CM | POA: Diagnosis not present

## 2015-06-05 DIAGNOSIS — G20 Parkinson's disease: Secondary | ICD-10-CM | POA: Diagnosis not present

## 2015-06-06 DIAGNOSIS — G20 Parkinson's disease: Secondary | ICD-10-CM | POA: Diagnosis not present

## 2015-06-07 DIAGNOSIS — G20 Parkinson's disease: Secondary | ICD-10-CM | POA: Diagnosis not present

## 2015-06-08 DIAGNOSIS — G20 Parkinson's disease: Secondary | ICD-10-CM | POA: Diagnosis not present

## 2015-06-09 DIAGNOSIS — G20 Parkinson's disease: Secondary | ICD-10-CM | POA: Diagnosis not present

## 2015-06-10 DIAGNOSIS — G20 Parkinson's disease: Secondary | ICD-10-CM | POA: Diagnosis not present

## 2015-06-11 DIAGNOSIS — G20 Parkinson's disease: Secondary | ICD-10-CM | POA: Diagnosis not present

## 2015-06-12 DIAGNOSIS — G20 Parkinson's disease: Secondary | ICD-10-CM | POA: Diagnosis not present

## 2015-06-13 DIAGNOSIS — G20 Parkinson's disease: Secondary | ICD-10-CM | POA: Diagnosis not present

## 2015-06-14 DIAGNOSIS — G20 Parkinson's disease: Secondary | ICD-10-CM | POA: Diagnosis not present

## 2015-06-15 DIAGNOSIS — G20 Parkinson's disease: Secondary | ICD-10-CM | POA: Diagnosis not present

## 2015-06-16 DIAGNOSIS — G20 Parkinson's disease: Secondary | ICD-10-CM | POA: Diagnosis not present

## 2015-06-17 DIAGNOSIS — G20 Parkinson's disease: Secondary | ICD-10-CM | POA: Diagnosis not present

## 2015-06-18 DIAGNOSIS — G20 Parkinson's disease: Secondary | ICD-10-CM | POA: Diagnosis not present

## 2015-06-19 DIAGNOSIS — G20 Parkinson's disease: Secondary | ICD-10-CM | POA: Diagnosis not present

## 2015-06-20 DIAGNOSIS — G20 Parkinson's disease: Secondary | ICD-10-CM | POA: Diagnosis not present

## 2015-06-21 DIAGNOSIS — G20 Parkinson's disease: Secondary | ICD-10-CM | POA: Diagnosis not present

## 2015-06-22 DIAGNOSIS — G20 Parkinson's disease: Secondary | ICD-10-CM | POA: Diagnosis not present

## 2015-06-23 DIAGNOSIS — G20 Parkinson's disease: Secondary | ICD-10-CM | POA: Diagnosis not present

## 2015-06-24 DIAGNOSIS — G20 Parkinson's disease: Secondary | ICD-10-CM | POA: Diagnosis not present

## 2015-06-25 DIAGNOSIS — G20 Parkinson's disease: Secondary | ICD-10-CM | POA: Diagnosis not present

## 2015-06-26 DIAGNOSIS — G20 Parkinson's disease: Secondary | ICD-10-CM | POA: Diagnosis not present

## 2015-06-27 DIAGNOSIS — G20 Parkinson's disease: Secondary | ICD-10-CM | POA: Diagnosis not present

## 2015-06-28 DIAGNOSIS — G20 Parkinson's disease: Secondary | ICD-10-CM | POA: Diagnosis not present

## 2015-06-29 DIAGNOSIS — G20 Parkinson's disease: Secondary | ICD-10-CM | POA: Diagnosis not present

## 2015-06-30 DIAGNOSIS — G20 Parkinson's disease: Secondary | ICD-10-CM | POA: Diagnosis not present

## 2015-07-01 DIAGNOSIS — G20 Parkinson's disease: Secondary | ICD-10-CM | POA: Diagnosis not present

## 2015-07-02 DIAGNOSIS — G20 Parkinson's disease: Secondary | ICD-10-CM | POA: Diagnosis not present

## 2015-07-03 DIAGNOSIS — G20 Parkinson's disease: Secondary | ICD-10-CM | POA: Diagnosis not present

## 2015-07-04 DIAGNOSIS — G20 Parkinson's disease: Secondary | ICD-10-CM | POA: Diagnosis not present

## 2015-07-05 DIAGNOSIS — G20 Parkinson's disease: Secondary | ICD-10-CM | POA: Diagnosis not present

## 2015-07-06 DIAGNOSIS — G20 Parkinson's disease: Secondary | ICD-10-CM | POA: Diagnosis not present

## 2015-07-07 DIAGNOSIS — G20 Parkinson's disease: Secondary | ICD-10-CM | POA: Diagnosis not present

## 2015-07-08 DIAGNOSIS — G20 Parkinson's disease: Secondary | ICD-10-CM | POA: Diagnosis not present

## 2015-07-09 DIAGNOSIS — G20 Parkinson's disease: Secondary | ICD-10-CM | POA: Diagnosis not present

## 2015-07-10 DIAGNOSIS — G20 Parkinson's disease: Secondary | ICD-10-CM | POA: Diagnosis not present

## 2015-07-11 DIAGNOSIS — G20 Parkinson's disease: Secondary | ICD-10-CM | POA: Diagnosis not present

## 2015-07-12 DIAGNOSIS — G20 Parkinson's disease: Secondary | ICD-10-CM | POA: Diagnosis not present

## 2015-07-13 DIAGNOSIS — G20 Parkinson's disease: Secondary | ICD-10-CM | POA: Diagnosis not present

## 2015-07-14 DIAGNOSIS — G20 Parkinson's disease: Secondary | ICD-10-CM | POA: Diagnosis not present

## 2015-07-15 DIAGNOSIS — G20 Parkinson's disease: Secondary | ICD-10-CM | POA: Diagnosis not present

## 2015-07-16 DIAGNOSIS — G20 Parkinson's disease: Secondary | ICD-10-CM | POA: Diagnosis not present

## 2015-07-17 DIAGNOSIS — G20 Parkinson's disease: Secondary | ICD-10-CM | POA: Diagnosis not present

## 2015-07-18 DIAGNOSIS — G20 Parkinson's disease: Secondary | ICD-10-CM | POA: Diagnosis not present

## 2015-07-19 DIAGNOSIS — G20 Parkinson's disease: Secondary | ICD-10-CM | POA: Diagnosis not present

## 2015-07-20 DIAGNOSIS — G20 Parkinson's disease: Secondary | ICD-10-CM | POA: Diagnosis not present

## 2015-07-21 DIAGNOSIS — G20 Parkinson's disease: Secondary | ICD-10-CM | POA: Diagnosis not present

## 2015-07-22 DIAGNOSIS — G20 Parkinson's disease: Secondary | ICD-10-CM | POA: Diagnosis not present

## 2015-07-23 DIAGNOSIS — G20 Parkinson's disease: Secondary | ICD-10-CM | POA: Diagnosis not present

## 2015-07-24 DIAGNOSIS — G20 Parkinson's disease: Secondary | ICD-10-CM | POA: Diagnosis not present

## 2015-07-25 DIAGNOSIS — G20 Parkinson's disease: Secondary | ICD-10-CM | POA: Diagnosis not present

## 2015-07-26 DIAGNOSIS — G20 Parkinson's disease: Secondary | ICD-10-CM | POA: Diagnosis not present

## 2015-07-27 DIAGNOSIS — G20 Parkinson's disease: Secondary | ICD-10-CM | POA: Diagnosis not present

## 2015-07-28 DIAGNOSIS — G20 Parkinson's disease: Secondary | ICD-10-CM | POA: Diagnosis not present

## 2015-07-29 DIAGNOSIS — G20 Parkinson's disease: Secondary | ICD-10-CM | POA: Diagnosis not present

## 2015-07-30 DIAGNOSIS — G20 Parkinson's disease: Secondary | ICD-10-CM | POA: Diagnosis not present

## 2015-07-31 DIAGNOSIS — G20 Parkinson's disease: Secondary | ICD-10-CM | POA: Diagnosis not present

## 2015-08-01 DIAGNOSIS — G20 Parkinson's disease: Secondary | ICD-10-CM | POA: Diagnosis not present

## 2015-08-02 DIAGNOSIS — G20 Parkinson's disease: Secondary | ICD-10-CM | POA: Diagnosis not present

## 2015-08-03 DIAGNOSIS — G20 Parkinson's disease: Secondary | ICD-10-CM | POA: Diagnosis not present

## 2015-08-04 DIAGNOSIS — G20 Parkinson's disease: Secondary | ICD-10-CM | POA: Diagnosis not present

## 2015-08-05 DIAGNOSIS — G20 Parkinson's disease: Secondary | ICD-10-CM | POA: Diagnosis not present

## 2015-08-06 DIAGNOSIS — G20 Parkinson's disease: Secondary | ICD-10-CM | POA: Diagnosis not present

## 2015-08-07 DIAGNOSIS — G20 Parkinson's disease: Secondary | ICD-10-CM | POA: Diagnosis not present

## 2015-08-08 DIAGNOSIS — G20 Parkinson's disease: Secondary | ICD-10-CM | POA: Diagnosis not present

## 2015-08-09 DIAGNOSIS — G20 Parkinson's disease: Secondary | ICD-10-CM | POA: Diagnosis not present

## 2015-08-10 DIAGNOSIS — G20 Parkinson's disease: Secondary | ICD-10-CM | POA: Diagnosis not present

## 2015-08-11 DIAGNOSIS — G20 Parkinson's disease: Secondary | ICD-10-CM | POA: Diagnosis not present

## 2015-08-12 DIAGNOSIS — G20 Parkinson's disease: Secondary | ICD-10-CM | POA: Diagnosis not present

## 2015-08-13 DIAGNOSIS — G20 Parkinson's disease: Secondary | ICD-10-CM | POA: Diagnosis not present

## 2015-08-14 DIAGNOSIS — G20 Parkinson's disease: Secondary | ICD-10-CM | POA: Diagnosis not present

## 2015-08-15 DIAGNOSIS — G20 Parkinson's disease: Secondary | ICD-10-CM | POA: Diagnosis not present

## 2015-08-16 DIAGNOSIS — G20 Parkinson's disease: Secondary | ICD-10-CM | POA: Diagnosis not present

## 2015-09-14 DIAGNOSIS — G20 Parkinson's disease: Secondary | ICD-10-CM | POA: Diagnosis not present

## 2015-09-15 DIAGNOSIS — G20 Parkinson's disease: Secondary | ICD-10-CM | POA: Diagnosis not present

## 2015-09-16 DIAGNOSIS — G20 Parkinson's disease: Secondary | ICD-10-CM | POA: Diagnosis not present

## 2015-09-16 DIAGNOSIS — R300 Dysuria: Secondary | ICD-10-CM | POA: Diagnosis not present

## 2015-09-17 DIAGNOSIS — G20 Parkinson's disease: Secondary | ICD-10-CM | POA: Diagnosis not present

## 2015-09-18 DIAGNOSIS — G20 Parkinson's disease: Secondary | ICD-10-CM | POA: Diagnosis not present

## 2015-09-19 DIAGNOSIS — G20 Parkinson's disease: Secondary | ICD-10-CM | POA: Diagnosis not present

## 2015-09-20 DIAGNOSIS — G20 Parkinson's disease: Secondary | ICD-10-CM | POA: Diagnosis not present

## 2015-09-21 DIAGNOSIS — G20 Parkinson's disease: Secondary | ICD-10-CM | POA: Diagnosis not present

## 2015-09-22 DIAGNOSIS — G20 Parkinson's disease: Secondary | ICD-10-CM | POA: Diagnosis not present

## 2015-09-23 DIAGNOSIS — G20 Parkinson's disease: Secondary | ICD-10-CM | POA: Diagnosis not present

## 2015-09-24 DIAGNOSIS — G20 Parkinson's disease: Secondary | ICD-10-CM | POA: Diagnosis not present

## 2015-09-25 DIAGNOSIS — G20 Parkinson's disease: Secondary | ICD-10-CM | POA: Diagnosis not present

## 2015-09-26 DIAGNOSIS — G20 Parkinson's disease: Secondary | ICD-10-CM | POA: Diagnosis not present

## 2015-09-27 DIAGNOSIS — G20 Parkinson's disease: Secondary | ICD-10-CM | POA: Diagnosis not present

## 2015-09-28 DIAGNOSIS — G20 Parkinson's disease: Secondary | ICD-10-CM | POA: Diagnosis not present

## 2015-09-29 DIAGNOSIS — G20 Parkinson's disease: Secondary | ICD-10-CM | POA: Diagnosis not present

## 2015-09-30 DIAGNOSIS — G20 Parkinson's disease: Secondary | ICD-10-CM | POA: Diagnosis not present

## 2015-10-01 DIAGNOSIS — G20 Parkinson's disease: Secondary | ICD-10-CM | POA: Diagnosis not present

## 2015-10-02 DIAGNOSIS — G20 Parkinson's disease: Secondary | ICD-10-CM | POA: Diagnosis not present

## 2015-10-03 DIAGNOSIS — G20 Parkinson's disease: Secondary | ICD-10-CM | POA: Diagnosis not present

## 2015-10-04 DIAGNOSIS — G20 Parkinson's disease: Secondary | ICD-10-CM | POA: Diagnosis not present

## 2015-10-05 DIAGNOSIS — G20 Parkinson's disease: Secondary | ICD-10-CM | POA: Diagnosis not present

## 2015-10-06 DIAGNOSIS — G20 Parkinson's disease: Secondary | ICD-10-CM | POA: Diagnosis not present

## 2015-10-07 DIAGNOSIS — G20 Parkinson's disease: Secondary | ICD-10-CM | POA: Diagnosis not present

## 2015-10-08 DIAGNOSIS — G20 Parkinson's disease: Secondary | ICD-10-CM | POA: Diagnosis not present

## 2015-10-09 DIAGNOSIS — G20 Parkinson's disease: Secondary | ICD-10-CM | POA: Diagnosis not present

## 2015-10-10 DIAGNOSIS — G20 Parkinson's disease: Secondary | ICD-10-CM | POA: Diagnosis not present

## 2015-10-11 DIAGNOSIS — G20 Parkinson's disease: Secondary | ICD-10-CM | POA: Diagnosis not present

## 2015-10-12 DIAGNOSIS — G20 Parkinson's disease: Secondary | ICD-10-CM | POA: Diagnosis not present

## 2015-10-13 DIAGNOSIS — G20 Parkinson's disease: Secondary | ICD-10-CM | POA: Diagnosis not present

## 2015-10-14 DIAGNOSIS — G20 Parkinson's disease: Secondary | ICD-10-CM | POA: Diagnosis not present

## 2015-10-15 DIAGNOSIS — G20 Parkinson's disease: Secondary | ICD-10-CM | POA: Diagnosis not present

## 2015-10-16 DIAGNOSIS — G20 Parkinson's disease: Secondary | ICD-10-CM | POA: Diagnosis not present

## 2015-10-17 DIAGNOSIS — G20 Parkinson's disease: Secondary | ICD-10-CM | POA: Diagnosis not present

## 2015-10-18 DIAGNOSIS — G20 Parkinson's disease: Secondary | ICD-10-CM | POA: Diagnosis not present

## 2015-10-19 DIAGNOSIS — G20 Parkinson's disease: Secondary | ICD-10-CM | POA: Diagnosis not present

## 2015-10-20 DIAGNOSIS — G20 Parkinson's disease: Secondary | ICD-10-CM | POA: Diagnosis not present

## 2015-10-21 DIAGNOSIS — G20 Parkinson's disease: Secondary | ICD-10-CM | POA: Diagnosis not present

## 2015-10-22 DIAGNOSIS — G20 Parkinson's disease: Secondary | ICD-10-CM | POA: Diagnosis not present

## 2015-10-23 DIAGNOSIS — G20 Parkinson's disease: Secondary | ICD-10-CM | POA: Diagnosis not present

## 2015-10-24 DIAGNOSIS — G20 Parkinson's disease: Secondary | ICD-10-CM | POA: Diagnosis not present

## 2015-10-25 DIAGNOSIS — G20 Parkinson's disease: Secondary | ICD-10-CM | POA: Diagnosis not present

## 2015-10-26 DIAGNOSIS — G20 Parkinson's disease: Secondary | ICD-10-CM | POA: Diagnosis not present

## 2015-10-27 DIAGNOSIS — G20 Parkinson's disease: Secondary | ICD-10-CM | POA: Diagnosis not present

## 2015-10-28 DIAGNOSIS — G20 Parkinson's disease: Secondary | ICD-10-CM | POA: Diagnosis not present

## 2015-10-29 DIAGNOSIS — G20 Parkinson's disease: Secondary | ICD-10-CM | POA: Diagnosis not present

## 2015-10-30 DIAGNOSIS — G20 Parkinson's disease: Secondary | ICD-10-CM | POA: Diagnosis not present

## 2015-10-31 DIAGNOSIS — G20 Parkinson's disease: Secondary | ICD-10-CM | POA: Diagnosis not present

## 2015-11-01 DIAGNOSIS — G20 Parkinson's disease: Secondary | ICD-10-CM | POA: Diagnosis not present

## 2015-11-02 DIAGNOSIS — G20 Parkinson's disease: Secondary | ICD-10-CM | POA: Diagnosis not present

## 2015-11-03 DIAGNOSIS — G20 Parkinson's disease: Secondary | ICD-10-CM | POA: Diagnosis not present

## 2015-11-04 DIAGNOSIS — G20 Parkinson's disease: Secondary | ICD-10-CM | POA: Diagnosis not present

## 2015-11-05 DIAGNOSIS — G20 Parkinson's disease: Secondary | ICD-10-CM | POA: Diagnosis not present

## 2015-11-06 DIAGNOSIS — G20 Parkinson's disease: Secondary | ICD-10-CM | POA: Diagnosis not present

## 2015-11-07 DIAGNOSIS — G20 Parkinson's disease: Secondary | ICD-10-CM | POA: Diagnosis not present

## 2015-11-08 DIAGNOSIS — G20 Parkinson's disease: Secondary | ICD-10-CM | POA: Diagnosis not present

## 2015-11-09 DIAGNOSIS — G20 Parkinson's disease: Secondary | ICD-10-CM | POA: Diagnosis not present

## 2015-11-10 DIAGNOSIS — G20 Parkinson's disease: Secondary | ICD-10-CM | POA: Diagnosis not present

## 2015-11-11 DIAGNOSIS — G20 Parkinson's disease: Secondary | ICD-10-CM | POA: Diagnosis not present

## 2015-11-12 DIAGNOSIS — G20 Parkinson's disease: Secondary | ICD-10-CM | POA: Diagnosis not present

## 2015-11-13 DIAGNOSIS — G20 Parkinson's disease: Secondary | ICD-10-CM | POA: Diagnosis not present

## 2015-11-14 DIAGNOSIS — G20 Parkinson's disease: Secondary | ICD-10-CM | POA: Diagnosis not present

## 2015-11-15 DIAGNOSIS — G20 Parkinson's disease: Secondary | ICD-10-CM | POA: Diagnosis not present

## 2015-11-16 DIAGNOSIS — G20 Parkinson's disease: Secondary | ICD-10-CM | POA: Diagnosis not present

## 2015-11-17 DIAGNOSIS — G20 Parkinson's disease: Secondary | ICD-10-CM | POA: Diagnosis not present

## 2015-11-18 DIAGNOSIS — G20 Parkinson's disease: Secondary | ICD-10-CM | POA: Diagnosis not present

## 2015-11-19 DIAGNOSIS — G20 Parkinson's disease: Secondary | ICD-10-CM | POA: Diagnosis not present

## 2015-11-20 DIAGNOSIS — G20 Parkinson's disease: Secondary | ICD-10-CM | POA: Diagnosis not present

## 2015-11-21 DIAGNOSIS — G20 Parkinson's disease: Secondary | ICD-10-CM | POA: Diagnosis not present

## 2015-11-22 DIAGNOSIS — G20 Parkinson's disease: Secondary | ICD-10-CM | POA: Diagnosis not present

## 2015-11-23 DIAGNOSIS — G20 Parkinson's disease: Secondary | ICD-10-CM | POA: Diagnosis not present

## 2015-11-24 DIAGNOSIS — G20 Parkinson's disease: Secondary | ICD-10-CM | POA: Diagnosis not present

## 2015-11-25 DIAGNOSIS — G20 Parkinson's disease: Secondary | ICD-10-CM | POA: Diagnosis not present

## 2015-11-26 DIAGNOSIS — G20 Parkinson's disease: Secondary | ICD-10-CM | POA: Diagnosis not present

## 2015-11-27 DIAGNOSIS — G20 Parkinson's disease: Secondary | ICD-10-CM | POA: Diagnosis not present

## 2015-11-28 DIAGNOSIS — G20 Parkinson's disease: Secondary | ICD-10-CM | POA: Diagnosis not present

## 2015-11-29 DIAGNOSIS — G20 Parkinson's disease: Secondary | ICD-10-CM | POA: Diagnosis not present

## 2015-11-30 DIAGNOSIS — G20 Parkinson's disease: Secondary | ICD-10-CM | POA: Diagnosis not present

## 2015-12-01 DIAGNOSIS — G20 Parkinson's disease: Secondary | ICD-10-CM | POA: Diagnosis not present

## 2015-12-02 DIAGNOSIS — G20 Parkinson's disease: Secondary | ICD-10-CM | POA: Diagnosis not present

## 2015-12-03 DIAGNOSIS — G20 Parkinson's disease: Secondary | ICD-10-CM | POA: Diagnosis not present

## 2015-12-04 DIAGNOSIS — G20 Parkinson's disease: Secondary | ICD-10-CM | POA: Diagnosis not present

## 2015-12-05 DIAGNOSIS — G20 Parkinson's disease: Secondary | ICD-10-CM | POA: Diagnosis not present

## 2015-12-06 DIAGNOSIS — G20 Parkinson's disease: Secondary | ICD-10-CM | POA: Diagnosis not present

## 2015-12-07 DIAGNOSIS — G20 Parkinson's disease: Secondary | ICD-10-CM | POA: Diagnosis not present

## 2015-12-08 DIAGNOSIS — G20 Parkinson's disease: Secondary | ICD-10-CM | POA: Diagnosis not present

## 2015-12-09 DIAGNOSIS — M159 Polyosteoarthritis, unspecified: Secondary | ICD-10-CM | POA: Diagnosis not present

## 2015-12-09 DIAGNOSIS — I10 Essential (primary) hypertension: Secondary | ICD-10-CM | POA: Diagnosis not present

## 2015-12-09 DIAGNOSIS — G20 Parkinson's disease: Secondary | ICD-10-CM | POA: Diagnosis not present

## 2015-12-10 DIAGNOSIS — G20 Parkinson's disease: Secondary | ICD-10-CM | POA: Diagnosis not present

## 2015-12-11 DIAGNOSIS — G20 Parkinson's disease: Secondary | ICD-10-CM | POA: Diagnosis not present

## 2015-12-12 DIAGNOSIS — G20 Parkinson's disease: Secondary | ICD-10-CM | POA: Diagnosis not present

## 2015-12-13 DIAGNOSIS — G20 Parkinson's disease: Secondary | ICD-10-CM | POA: Diagnosis not present

## 2015-12-14 DIAGNOSIS — G20 Parkinson's disease: Secondary | ICD-10-CM | POA: Diagnosis not present

## 2015-12-15 DIAGNOSIS — G20 Parkinson's disease: Secondary | ICD-10-CM | POA: Diagnosis not present

## 2015-12-16 DIAGNOSIS — G20 Parkinson's disease: Secondary | ICD-10-CM | POA: Diagnosis not present

## 2015-12-17 DIAGNOSIS — G20 Parkinson's disease: Secondary | ICD-10-CM | POA: Diagnosis not present

## 2015-12-18 DIAGNOSIS — G20 Parkinson's disease: Secondary | ICD-10-CM | POA: Diagnosis not present

## 2015-12-19 DIAGNOSIS — G20 Parkinson's disease: Secondary | ICD-10-CM | POA: Diagnosis not present

## 2015-12-19 DIAGNOSIS — Z0000 Encounter for general adult medical examination without abnormal findings: Secondary | ICD-10-CM | POA: Diagnosis not present

## 2015-12-19 DIAGNOSIS — E7800 Pure hypercholesterolemia, unspecified: Secondary | ICD-10-CM | POA: Diagnosis not present

## 2015-12-19 DIAGNOSIS — Z23 Encounter for immunization: Secondary | ICD-10-CM | POA: Diagnosis not present

## 2015-12-19 DIAGNOSIS — R300 Dysuria: Secondary | ICD-10-CM | POA: Diagnosis not present

## 2015-12-19 DIAGNOSIS — F0280 Dementia in other diseases classified elsewhere without behavioral disturbance: Secondary | ICD-10-CM | POA: Diagnosis not present

## 2015-12-19 DIAGNOSIS — M159 Polyosteoarthritis, unspecified: Secondary | ICD-10-CM | POA: Diagnosis not present

## 2015-12-20 DIAGNOSIS — G20 Parkinson's disease: Secondary | ICD-10-CM | POA: Diagnosis not present

## 2015-12-21 DIAGNOSIS — G20 Parkinson's disease: Secondary | ICD-10-CM | POA: Diagnosis not present

## 2015-12-22 DIAGNOSIS — G20 Parkinson's disease: Secondary | ICD-10-CM | POA: Diagnosis not present

## 2015-12-23 DIAGNOSIS — G20 Parkinson's disease: Secondary | ICD-10-CM | POA: Diagnosis not present

## 2015-12-24 DIAGNOSIS — G20 Parkinson's disease: Secondary | ICD-10-CM | POA: Diagnosis not present

## 2015-12-25 DIAGNOSIS — G20 Parkinson's disease: Secondary | ICD-10-CM | POA: Diagnosis not present

## 2015-12-26 DIAGNOSIS — G20 Parkinson's disease: Secondary | ICD-10-CM | POA: Diagnosis not present

## 2015-12-27 DIAGNOSIS — G20 Parkinson's disease: Secondary | ICD-10-CM | POA: Diagnosis not present

## 2015-12-28 DIAGNOSIS — G20 Parkinson's disease: Secondary | ICD-10-CM | POA: Diagnosis not present

## 2015-12-29 DIAGNOSIS — G20 Parkinson's disease: Secondary | ICD-10-CM | POA: Diagnosis not present

## 2015-12-30 DIAGNOSIS — G20 Parkinson's disease: Secondary | ICD-10-CM | POA: Diagnosis not present

## 2015-12-31 DIAGNOSIS — G20 Parkinson's disease: Secondary | ICD-10-CM | POA: Diagnosis not present

## 2016-01-01 DIAGNOSIS — G20 Parkinson's disease: Secondary | ICD-10-CM | POA: Diagnosis not present

## 2016-01-02 DIAGNOSIS — G20 Parkinson's disease: Secondary | ICD-10-CM | POA: Diagnosis not present

## 2016-01-03 DIAGNOSIS — G20 Parkinson's disease: Secondary | ICD-10-CM | POA: Diagnosis not present

## 2016-01-04 DIAGNOSIS — G20 Parkinson's disease: Secondary | ICD-10-CM | POA: Diagnosis not present

## 2016-01-05 DIAGNOSIS — G20 Parkinson's disease: Secondary | ICD-10-CM | POA: Diagnosis not present

## 2016-01-05 DIAGNOSIS — R32 Unspecified urinary incontinence: Secondary | ICD-10-CM | POA: Diagnosis not present

## 2016-01-06 DIAGNOSIS — G20 Parkinson's disease: Secondary | ICD-10-CM | POA: Diagnosis not present

## 2016-01-07 DIAGNOSIS — G20 Parkinson's disease: Secondary | ICD-10-CM | POA: Diagnosis not present

## 2016-01-08 DIAGNOSIS — G20 Parkinson's disease: Secondary | ICD-10-CM | POA: Diagnosis not present

## 2016-01-09 DIAGNOSIS — G20 Parkinson's disease: Secondary | ICD-10-CM | POA: Diagnosis not present

## 2016-01-10 DIAGNOSIS — G20 Parkinson's disease: Secondary | ICD-10-CM | POA: Diagnosis not present

## 2016-01-11 DIAGNOSIS — G20 Parkinson's disease: Secondary | ICD-10-CM | POA: Diagnosis not present

## 2016-01-12 DIAGNOSIS — G20 Parkinson's disease: Secondary | ICD-10-CM | POA: Diagnosis not present

## 2016-01-13 DIAGNOSIS — G20 Parkinson's disease: Secondary | ICD-10-CM | POA: Diagnosis not present

## 2016-01-14 DIAGNOSIS — G20 Parkinson's disease: Secondary | ICD-10-CM | POA: Diagnosis not present

## 2016-01-15 DIAGNOSIS — G20 Parkinson's disease: Secondary | ICD-10-CM | POA: Diagnosis not present

## 2016-01-16 DIAGNOSIS — G20 Parkinson's disease: Secondary | ICD-10-CM | POA: Diagnosis not present

## 2016-01-17 DIAGNOSIS — G20 Parkinson's disease: Secondary | ICD-10-CM | POA: Diagnosis not present

## 2016-01-18 DIAGNOSIS — G20 Parkinson's disease: Secondary | ICD-10-CM | POA: Diagnosis not present

## 2016-01-19 DIAGNOSIS — G20 Parkinson's disease: Secondary | ICD-10-CM | POA: Diagnosis not present

## 2016-01-20 DIAGNOSIS — G20 Parkinson's disease: Secondary | ICD-10-CM | POA: Diagnosis not present

## 2016-01-21 DIAGNOSIS — G20 Parkinson's disease: Secondary | ICD-10-CM | POA: Diagnosis not present

## 2016-01-22 DIAGNOSIS — G20 Parkinson's disease: Secondary | ICD-10-CM | POA: Diagnosis not present

## 2016-01-23 DIAGNOSIS — G20 Parkinson's disease: Secondary | ICD-10-CM | POA: Diagnosis not present

## 2016-01-24 DIAGNOSIS — G20 Parkinson's disease: Secondary | ICD-10-CM | POA: Diagnosis not present

## 2016-01-25 DIAGNOSIS — G20 Parkinson's disease: Secondary | ICD-10-CM | POA: Diagnosis not present

## 2016-01-26 DIAGNOSIS — G20 Parkinson's disease: Secondary | ICD-10-CM | POA: Diagnosis not present

## 2016-01-27 DIAGNOSIS — G20 Parkinson's disease: Secondary | ICD-10-CM | POA: Diagnosis not present

## 2016-01-28 DIAGNOSIS — G20 Parkinson's disease: Secondary | ICD-10-CM | POA: Diagnosis not present

## 2016-01-29 DIAGNOSIS — G20 Parkinson's disease: Secondary | ICD-10-CM | POA: Diagnosis not present

## 2016-01-30 DIAGNOSIS — G20 Parkinson's disease: Secondary | ICD-10-CM | POA: Diagnosis not present

## 2016-01-31 DIAGNOSIS — G20 Parkinson's disease: Secondary | ICD-10-CM | POA: Diagnosis not present

## 2016-02-01 DIAGNOSIS — G20 Parkinson's disease: Secondary | ICD-10-CM | POA: Diagnosis not present

## 2016-02-02 DIAGNOSIS — R32 Unspecified urinary incontinence: Secondary | ICD-10-CM | POA: Diagnosis not present

## 2016-02-02 DIAGNOSIS — G20 Parkinson's disease: Secondary | ICD-10-CM | POA: Diagnosis not present

## 2016-02-03 DIAGNOSIS — G20 Parkinson's disease: Secondary | ICD-10-CM | POA: Diagnosis not present

## 2016-02-04 DIAGNOSIS — G20 Parkinson's disease: Secondary | ICD-10-CM | POA: Diagnosis not present

## 2016-02-05 DIAGNOSIS — G20 Parkinson's disease: Secondary | ICD-10-CM | POA: Diagnosis not present

## 2016-02-06 DIAGNOSIS — G20 Parkinson's disease: Secondary | ICD-10-CM | POA: Diagnosis not present

## 2016-02-07 DIAGNOSIS — G20 Parkinson's disease: Secondary | ICD-10-CM | POA: Diagnosis not present

## 2016-02-08 DIAGNOSIS — G20 Parkinson's disease: Secondary | ICD-10-CM | POA: Diagnosis not present

## 2016-02-09 DIAGNOSIS — G20 Parkinson's disease: Secondary | ICD-10-CM | POA: Diagnosis not present

## 2016-02-10 DIAGNOSIS — G20 Parkinson's disease: Secondary | ICD-10-CM | POA: Diagnosis not present

## 2016-02-11 DIAGNOSIS — G20 Parkinson's disease: Secondary | ICD-10-CM | POA: Diagnosis not present

## 2016-02-12 DIAGNOSIS — G20 Parkinson's disease: Secondary | ICD-10-CM | POA: Diagnosis not present

## 2016-02-13 DIAGNOSIS — G20 Parkinson's disease: Secondary | ICD-10-CM | POA: Diagnosis not present

## 2016-02-14 DIAGNOSIS — G20 Parkinson's disease: Secondary | ICD-10-CM | POA: Diagnosis not present

## 2016-02-15 DIAGNOSIS — G20 Parkinson's disease: Secondary | ICD-10-CM | POA: Diagnosis not present

## 2016-02-16 DIAGNOSIS — G20 Parkinson's disease: Secondary | ICD-10-CM | POA: Diagnosis not present

## 2016-02-17 DIAGNOSIS — G20 Parkinson's disease: Secondary | ICD-10-CM | POA: Diagnosis not present

## 2016-02-18 DIAGNOSIS — G20 Parkinson's disease: Secondary | ICD-10-CM | POA: Diagnosis not present

## 2016-02-19 DIAGNOSIS — G20 Parkinson's disease: Secondary | ICD-10-CM | POA: Diagnosis not present

## 2016-02-20 DIAGNOSIS — G20 Parkinson's disease: Secondary | ICD-10-CM | POA: Diagnosis not present

## 2016-02-21 DIAGNOSIS — G20 Parkinson's disease: Secondary | ICD-10-CM | POA: Diagnosis not present

## 2016-02-22 DIAGNOSIS — G20 Parkinson's disease: Secondary | ICD-10-CM | POA: Diagnosis not present

## 2016-02-23 DIAGNOSIS — G20 Parkinson's disease: Secondary | ICD-10-CM | POA: Diagnosis not present

## 2016-02-24 DIAGNOSIS — G20 Parkinson's disease: Secondary | ICD-10-CM | POA: Diagnosis not present

## 2016-02-25 DIAGNOSIS — G20 Parkinson's disease: Secondary | ICD-10-CM | POA: Diagnosis not present

## 2016-02-26 DIAGNOSIS — G20 Parkinson's disease: Secondary | ICD-10-CM | POA: Diagnosis not present

## 2016-02-27 DIAGNOSIS — G20 Parkinson's disease: Secondary | ICD-10-CM | POA: Diagnosis not present

## 2016-02-28 DIAGNOSIS — G20 Parkinson's disease: Secondary | ICD-10-CM | POA: Diagnosis not present

## 2016-02-29 DIAGNOSIS — G20 Parkinson's disease: Secondary | ICD-10-CM | POA: Diagnosis not present

## 2016-03-01 DIAGNOSIS — G20 Parkinson's disease: Secondary | ICD-10-CM | POA: Diagnosis not present

## 2016-03-01 DIAGNOSIS — R32 Unspecified urinary incontinence: Secondary | ICD-10-CM | POA: Diagnosis not present

## 2016-03-02 DIAGNOSIS — G20 Parkinson's disease: Secondary | ICD-10-CM | POA: Diagnosis not present

## 2016-03-03 DIAGNOSIS — G20 Parkinson's disease: Secondary | ICD-10-CM | POA: Diagnosis not present

## 2016-03-04 DIAGNOSIS — G20 Parkinson's disease: Secondary | ICD-10-CM | POA: Diagnosis not present

## 2016-03-05 DIAGNOSIS — G20 Parkinson's disease: Secondary | ICD-10-CM | POA: Diagnosis not present

## 2016-03-06 DIAGNOSIS — G20 Parkinson's disease: Secondary | ICD-10-CM | POA: Diagnosis not present

## 2016-03-07 DIAGNOSIS — G20 Parkinson's disease: Secondary | ICD-10-CM | POA: Diagnosis not present

## 2016-03-08 DIAGNOSIS — G20 Parkinson's disease: Secondary | ICD-10-CM | POA: Diagnosis not present

## 2016-03-09 DIAGNOSIS — G20 Parkinson's disease: Secondary | ICD-10-CM | POA: Diagnosis not present

## 2016-03-10 DIAGNOSIS — G20 Parkinson's disease: Secondary | ICD-10-CM | POA: Diagnosis not present

## 2016-03-11 DIAGNOSIS — G20 Parkinson's disease: Secondary | ICD-10-CM | POA: Diagnosis not present

## 2016-03-12 DIAGNOSIS — G20 Parkinson's disease: Secondary | ICD-10-CM | POA: Diagnosis not present

## 2016-03-13 DIAGNOSIS — G20 Parkinson's disease: Secondary | ICD-10-CM | POA: Diagnosis not present

## 2016-03-14 DIAGNOSIS — G20 Parkinson's disease: Secondary | ICD-10-CM | POA: Diagnosis not present

## 2016-03-15 DIAGNOSIS — G20 Parkinson's disease: Secondary | ICD-10-CM | POA: Diagnosis not present

## 2016-03-16 DIAGNOSIS — G20 Parkinson's disease: Secondary | ICD-10-CM | POA: Diagnosis not present

## 2016-03-17 DIAGNOSIS — G20 Parkinson's disease: Secondary | ICD-10-CM | POA: Diagnosis not present

## 2016-03-18 DIAGNOSIS — G20 Parkinson's disease: Secondary | ICD-10-CM | POA: Diagnosis not present

## 2016-03-19 DIAGNOSIS — G20 Parkinson's disease: Secondary | ICD-10-CM | POA: Diagnosis not present

## 2016-03-20 DIAGNOSIS — G20 Parkinson's disease: Secondary | ICD-10-CM | POA: Diagnosis not present

## 2016-03-21 DIAGNOSIS — G20 Parkinson's disease: Secondary | ICD-10-CM | POA: Diagnosis not present

## 2016-03-22 DIAGNOSIS — G20 Parkinson's disease: Secondary | ICD-10-CM | POA: Diagnosis not present

## 2016-03-23 DIAGNOSIS — G20 Parkinson's disease: Secondary | ICD-10-CM | POA: Diagnosis not present

## 2016-03-24 DIAGNOSIS — G20 Parkinson's disease: Secondary | ICD-10-CM | POA: Diagnosis not present

## 2016-03-25 DIAGNOSIS — G20 Parkinson's disease: Secondary | ICD-10-CM | POA: Diagnosis not present

## 2016-03-26 DIAGNOSIS — G20 Parkinson's disease: Secondary | ICD-10-CM | POA: Diagnosis not present

## 2016-03-27 DIAGNOSIS — G20 Parkinson's disease: Secondary | ICD-10-CM | POA: Diagnosis not present

## 2016-03-28 DIAGNOSIS — G20 Parkinson's disease: Secondary | ICD-10-CM | POA: Diagnosis not present

## 2016-03-29 DIAGNOSIS — G20 Parkinson's disease: Secondary | ICD-10-CM | POA: Diagnosis not present

## 2016-03-30 DIAGNOSIS — G20 Parkinson's disease: Secondary | ICD-10-CM | POA: Diagnosis not present

## 2016-03-31 DIAGNOSIS — G20 Parkinson's disease: Secondary | ICD-10-CM | POA: Diagnosis not present

## 2016-04-01 DIAGNOSIS — G20 Parkinson's disease: Secondary | ICD-10-CM | POA: Diagnosis not present

## 2016-04-02 DIAGNOSIS — G20 Parkinson's disease: Secondary | ICD-10-CM | POA: Diagnosis not present

## 2016-04-03 DIAGNOSIS — G20 Parkinson's disease: Secondary | ICD-10-CM | POA: Diagnosis not present

## 2016-04-04 DIAGNOSIS — G20 Parkinson's disease: Secondary | ICD-10-CM | POA: Diagnosis not present

## 2016-04-05 DIAGNOSIS — G20 Parkinson's disease: Secondary | ICD-10-CM | POA: Diagnosis not present

## 2016-04-05 DIAGNOSIS — R32 Unspecified urinary incontinence: Secondary | ICD-10-CM | POA: Diagnosis not present

## 2016-04-06 DIAGNOSIS — G20 Parkinson's disease: Secondary | ICD-10-CM | POA: Diagnosis not present

## 2016-04-07 DIAGNOSIS — G20 Parkinson's disease: Secondary | ICD-10-CM | POA: Diagnosis not present

## 2016-04-08 DIAGNOSIS — G20 Parkinson's disease: Secondary | ICD-10-CM | POA: Diagnosis not present

## 2016-04-09 DIAGNOSIS — G20 Parkinson's disease: Secondary | ICD-10-CM | POA: Diagnosis not present

## 2016-04-10 DIAGNOSIS — G20 Parkinson's disease: Secondary | ICD-10-CM | POA: Diagnosis not present

## 2016-04-11 DIAGNOSIS — G20 Parkinson's disease: Secondary | ICD-10-CM | POA: Diagnosis not present

## 2016-04-12 DIAGNOSIS — G20 Parkinson's disease: Secondary | ICD-10-CM | POA: Diagnosis not present

## 2016-04-13 DIAGNOSIS — G20 Parkinson's disease: Secondary | ICD-10-CM | POA: Diagnosis not present

## 2016-04-14 DIAGNOSIS — G20 Parkinson's disease: Secondary | ICD-10-CM | POA: Diagnosis not present

## 2016-04-15 DIAGNOSIS — G20 Parkinson's disease: Secondary | ICD-10-CM | POA: Diagnosis not present

## 2016-04-16 DIAGNOSIS — G20 Parkinson's disease: Secondary | ICD-10-CM | POA: Diagnosis not present

## 2016-04-17 DIAGNOSIS — G20 Parkinson's disease: Secondary | ICD-10-CM | POA: Diagnosis not present

## 2016-04-18 DIAGNOSIS — G20 Parkinson's disease: Secondary | ICD-10-CM | POA: Diagnosis not present

## 2016-04-19 DIAGNOSIS — G20 Parkinson's disease: Secondary | ICD-10-CM | POA: Diagnosis not present

## 2016-04-20 DIAGNOSIS — G20 Parkinson's disease: Secondary | ICD-10-CM | POA: Diagnosis not present

## 2016-04-21 DIAGNOSIS — G20 Parkinson's disease: Secondary | ICD-10-CM | POA: Diagnosis not present

## 2016-04-22 DIAGNOSIS — G20 Parkinson's disease: Secondary | ICD-10-CM | POA: Diagnosis not present

## 2016-04-23 DIAGNOSIS — G20 Parkinson's disease: Secondary | ICD-10-CM | POA: Diagnosis not present

## 2016-04-24 DIAGNOSIS — G20 Parkinson's disease: Secondary | ICD-10-CM | POA: Diagnosis not present

## 2016-04-25 DIAGNOSIS — G20 Parkinson's disease: Secondary | ICD-10-CM | POA: Diagnosis not present

## 2016-04-26 DIAGNOSIS — G20 Parkinson's disease: Secondary | ICD-10-CM | POA: Diagnosis not present

## 2016-04-27 DIAGNOSIS — G20 Parkinson's disease: Secondary | ICD-10-CM | POA: Diagnosis not present

## 2016-04-28 DIAGNOSIS — G20 Parkinson's disease: Secondary | ICD-10-CM | POA: Diagnosis not present

## 2016-04-29 DIAGNOSIS — G20 Parkinson's disease: Secondary | ICD-10-CM | POA: Diagnosis not present

## 2016-04-30 DIAGNOSIS — G20 Parkinson's disease: Secondary | ICD-10-CM | POA: Diagnosis not present

## 2016-05-01 DIAGNOSIS — G20 Parkinson's disease: Secondary | ICD-10-CM | POA: Diagnosis not present

## 2016-05-02 DIAGNOSIS — G20 Parkinson's disease: Secondary | ICD-10-CM | POA: Diagnosis not present

## 2016-05-02 DIAGNOSIS — R32 Unspecified urinary incontinence: Secondary | ICD-10-CM | POA: Diagnosis not present

## 2016-05-03 DIAGNOSIS — G20 Parkinson's disease: Secondary | ICD-10-CM | POA: Diagnosis not present

## 2016-05-04 DIAGNOSIS — G20 Parkinson's disease: Secondary | ICD-10-CM | POA: Diagnosis not present

## 2016-05-05 DIAGNOSIS — G20 Parkinson's disease: Secondary | ICD-10-CM | POA: Diagnosis not present

## 2016-05-06 DIAGNOSIS — G20 Parkinson's disease: Secondary | ICD-10-CM | POA: Diagnosis not present

## 2016-05-07 DIAGNOSIS — G20 Parkinson's disease: Secondary | ICD-10-CM | POA: Diagnosis not present

## 2016-05-08 DIAGNOSIS — G20 Parkinson's disease: Secondary | ICD-10-CM | POA: Diagnosis not present

## 2016-05-09 DIAGNOSIS — R4189 Other symptoms and signs involving cognitive functions and awareness: Secondary | ICD-10-CM | POA: Diagnosis not present

## 2016-05-09 DIAGNOSIS — M159 Polyosteoarthritis, unspecified: Secondary | ICD-10-CM | POA: Diagnosis not present

## 2016-05-09 DIAGNOSIS — Z23 Encounter for immunization: Secondary | ICD-10-CM | POA: Diagnosis not present

## 2016-05-09 DIAGNOSIS — R21 Rash and other nonspecific skin eruption: Secondary | ICD-10-CM | POA: Diagnosis not present

## 2016-05-09 DIAGNOSIS — Z0000 Encounter for general adult medical examination without abnormal findings: Secondary | ICD-10-CM | POA: Diagnosis not present

## 2016-05-09 DIAGNOSIS — F0280 Dementia in other diseases classified elsewhere without behavioral disturbance: Secondary | ICD-10-CM | POA: Diagnosis not present

## 2016-05-09 DIAGNOSIS — G20 Parkinson's disease: Secondary | ICD-10-CM | POA: Diagnosis not present

## 2016-05-09 DIAGNOSIS — I10 Essential (primary) hypertension: Secondary | ICD-10-CM | POA: Diagnosis not present

## 2016-05-10 DIAGNOSIS — G20 Parkinson's disease: Secondary | ICD-10-CM | POA: Diagnosis not present

## 2016-05-11 DIAGNOSIS — G20 Parkinson's disease: Secondary | ICD-10-CM | POA: Diagnosis not present

## 2016-05-12 DIAGNOSIS — G20 Parkinson's disease: Secondary | ICD-10-CM | POA: Diagnosis not present

## 2016-05-13 DIAGNOSIS — G20 Parkinson's disease: Secondary | ICD-10-CM | POA: Diagnosis not present

## 2016-05-14 DIAGNOSIS — G20 Parkinson's disease: Secondary | ICD-10-CM | POA: Diagnosis not present

## 2016-05-15 DIAGNOSIS — G20 Parkinson's disease: Secondary | ICD-10-CM | POA: Diagnosis not present

## 2016-05-16 DIAGNOSIS — G20 Parkinson's disease: Secondary | ICD-10-CM | POA: Diagnosis not present

## 2016-05-17 DIAGNOSIS — G20 Parkinson's disease: Secondary | ICD-10-CM | POA: Diagnosis not present

## 2016-05-18 DIAGNOSIS — G20 Parkinson's disease: Secondary | ICD-10-CM | POA: Diagnosis not present

## 2016-05-19 DIAGNOSIS — G20 Parkinson's disease: Secondary | ICD-10-CM | POA: Diagnosis not present

## 2016-05-20 DIAGNOSIS — G20 Parkinson's disease: Secondary | ICD-10-CM | POA: Diagnosis not present

## 2016-05-21 DIAGNOSIS — G20 Parkinson's disease: Secondary | ICD-10-CM | POA: Diagnosis not present

## 2016-05-22 DIAGNOSIS — G20 Parkinson's disease: Secondary | ICD-10-CM | POA: Diagnosis not present

## 2016-05-23 DIAGNOSIS — G20 Parkinson's disease: Secondary | ICD-10-CM | POA: Diagnosis not present

## 2016-05-24 DIAGNOSIS — G20 Parkinson's disease: Secondary | ICD-10-CM | POA: Diagnosis not present

## 2016-05-25 DIAGNOSIS — G20 Parkinson's disease: Secondary | ICD-10-CM | POA: Diagnosis not present

## 2016-05-26 DIAGNOSIS — G20 Parkinson's disease: Secondary | ICD-10-CM | POA: Diagnosis not present

## 2016-05-27 DIAGNOSIS — G20 Parkinson's disease: Secondary | ICD-10-CM | POA: Diagnosis not present

## 2016-05-28 DIAGNOSIS — G20 Parkinson's disease: Secondary | ICD-10-CM | POA: Diagnosis not present

## 2016-05-29 DIAGNOSIS — G20 Parkinson's disease: Secondary | ICD-10-CM | POA: Diagnosis not present

## 2016-05-30 DIAGNOSIS — G20 Parkinson's disease: Secondary | ICD-10-CM | POA: Diagnosis not present

## 2016-05-31 DIAGNOSIS — R32 Unspecified urinary incontinence: Secondary | ICD-10-CM | POA: Diagnosis not present

## 2016-05-31 DIAGNOSIS — G20 Parkinson's disease: Secondary | ICD-10-CM | POA: Diagnosis not present

## 2016-06-01 DIAGNOSIS — G20 Parkinson's disease: Secondary | ICD-10-CM | POA: Diagnosis not present

## 2016-06-02 DIAGNOSIS — G20 Parkinson's disease: Secondary | ICD-10-CM | POA: Diagnosis not present

## 2016-06-03 DIAGNOSIS — G20 Parkinson's disease: Secondary | ICD-10-CM | POA: Diagnosis not present

## 2016-06-04 DIAGNOSIS — G20 Parkinson's disease: Secondary | ICD-10-CM | POA: Diagnosis not present

## 2016-06-05 DIAGNOSIS — G20 Parkinson's disease: Secondary | ICD-10-CM | POA: Diagnosis not present

## 2016-06-06 DIAGNOSIS — G20 Parkinson's disease: Secondary | ICD-10-CM | POA: Diagnosis not present

## 2016-06-07 DIAGNOSIS — G20 Parkinson's disease: Secondary | ICD-10-CM | POA: Diagnosis not present

## 2016-06-08 DIAGNOSIS — G20 Parkinson's disease: Secondary | ICD-10-CM | POA: Diagnosis not present

## 2016-06-09 DIAGNOSIS — G20 Parkinson's disease: Secondary | ICD-10-CM | POA: Diagnosis not present

## 2016-06-10 DIAGNOSIS — G20 Parkinson's disease: Secondary | ICD-10-CM | POA: Diagnosis not present

## 2016-06-11 DIAGNOSIS — Z0000 Encounter for general adult medical examination without abnormal findings: Secondary | ICD-10-CM | POA: Diagnosis not present

## 2016-06-11 DIAGNOSIS — R300 Dysuria: Secondary | ICD-10-CM | POA: Diagnosis not present

## 2016-06-11 DIAGNOSIS — E7800 Pure hypercholesterolemia, unspecified: Secondary | ICD-10-CM | POA: Diagnosis not present

## 2016-06-11 DIAGNOSIS — F0280 Dementia in other diseases classified elsewhere without behavioral disturbance: Secondary | ICD-10-CM | POA: Diagnosis not present

## 2016-06-11 DIAGNOSIS — G20 Parkinson's disease: Secondary | ICD-10-CM | POA: Diagnosis not present

## 2016-06-11 DIAGNOSIS — M159 Polyosteoarthritis, unspecified: Secondary | ICD-10-CM | POA: Diagnosis not present

## 2016-06-12 DIAGNOSIS — G20 Parkinson's disease: Secondary | ICD-10-CM | POA: Diagnosis not present

## 2016-06-13 DIAGNOSIS — G20 Parkinson's disease: Secondary | ICD-10-CM | POA: Diagnosis not present

## 2016-06-14 DIAGNOSIS — G20 Parkinson's disease: Secondary | ICD-10-CM | POA: Diagnosis not present

## 2016-06-15 DIAGNOSIS — G20 Parkinson's disease: Secondary | ICD-10-CM | POA: Diagnosis not present

## 2016-06-16 DIAGNOSIS — G20 Parkinson's disease: Secondary | ICD-10-CM | POA: Diagnosis not present

## 2016-06-17 DIAGNOSIS — G20 Parkinson's disease: Secondary | ICD-10-CM | POA: Diagnosis not present

## 2016-06-18 DIAGNOSIS — G20 Parkinson's disease: Secondary | ICD-10-CM | POA: Diagnosis not present

## 2016-06-19 DIAGNOSIS — G20 Parkinson's disease: Secondary | ICD-10-CM | POA: Diagnosis not present

## 2016-06-20 DIAGNOSIS — G20 Parkinson's disease: Secondary | ICD-10-CM | POA: Diagnosis not present

## 2016-06-20 DIAGNOSIS — R400 Somnolence: Secondary | ICD-10-CM | POA: Diagnosis not present

## 2016-06-20 DIAGNOSIS — I10 Essential (primary) hypertension: Secondary | ICD-10-CM | POA: Diagnosis not present

## 2016-06-20 DIAGNOSIS — M159 Polyosteoarthritis, unspecified: Secondary | ICD-10-CM | POA: Diagnosis not present

## 2016-06-20 DIAGNOSIS — F0280 Dementia in other diseases classified elsewhere without behavioral disturbance: Secondary | ICD-10-CM | POA: Diagnosis not present

## 2016-06-21 DIAGNOSIS — G20 Parkinson's disease: Secondary | ICD-10-CM | POA: Diagnosis not present

## 2016-06-22 DIAGNOSIS — G20 Parkinson's disease: Secondary | ICD-10-CM | POA: Diagnosis not present

## 2016-06-23 DIAGNOSIS — G20 Parkinson's disease: Secondary | ICD-10-CM | POA: Diagnosis not present

## 2016-06-24 DIAGNOSIS — G20 Parkinson's disease: Secondary | ICD-10-CM | POA: Diagnosis not present

## 2016-06-25 DIAGNOSIS — G20 Parkinson's disease: Secondary | ICD-10-CM | POA: Diagnosis not present

## 2016-06-26 DIAGNOSIS — G20 Parkinson's disease: Secondary | ICD-10-CM | POA: Diagnosis not present

## 2016-06-27 DIAGNOSIS — G20 Parkinson's disease: Secondary | ICD-10-CM | POA: Diagnosis not present

## 2016-06-28 DIAGNOSIS — G20 Parkinson's disease: Secondary | ICD-10-CM | POA: Diagnosis not present

## 2016-06-29 DIAGNOSIS — G20 Parkinson's disease: Secondary | ICD-10-CM | POA: Diagnosis not present

## 2016-06-30 DIAGNOSIS — G20 Parkinson's disease: Secondary | ICD-10-CM | POA: Diagnosis not present

## 2016-07-01 DIAGNOSIS — G20 Parkinson's disease: Secondary | ICD-10-CM | POA: Diagnosis not present

## 2016-07-02 DIAGNOSIS — G20 Parkinson's disease: Secondary | ICD-10-CM | POA: Diagnosis not present

## 2016-07-03 DIAGNOSIS — G20 Parkinson's disease: Secondary | ICD-10-CM | POA: Diagnosis not present

## 2016-07-04 DIAGNOSIS — R32 Unspecified urinary incontinence: Secondary | ICD-10-CM | POA: Diagnosis not present

## 2016-07-04 DIAGNOSIS — R159 Full incontinence of feces: Secondary | ICD-10-CM | POA: Diagnosis not present

## 2016-07-04 DIAGNOSIS — G20 Parkinson's disease: Secondary | ICD-10-CM | POA: Diagnosis not present

## 2016-07-05 DIAGNOSIS — G20 Parkinson's disease: Secondary | ICD-10-CM | POA: Diagnosis not present

## 2016-07-06 DIAGNOSIS — G20 Parkinson's disease: Secondary | ICD-10-CM | POA: Diagnosis not present

## 2016-07-07 DIAGNOSIS — G20 Parkinson's disease: Secondary | ICD-10-CM | POA: Diagnosis not present

## 2016-07-08 DIAGNOSIS — G20 Parkinson's disease: Secondary | ICD-10-CM | POA: Diagnosis not present

## 2016-07-09 DIAGNOSIS — G20 Parkinson's disease: Secondary | ICD-10-CM | POA: Diagnosis not present

## 2016-07-10 DIAGNOSIS — G20 Parkinson's disease: Secondary | ICD-10-CM | POA: Diagnosis not present

## 2016-07-11 DIAGNOSIS — G20 Parkinson's disease: Secondary | ICD-10-CM | POA: Diagnosis not present

## 2016-07-12 DIAGNOSIS — G20 Parkinson's disease: Secondary | ICD-10-CM | POA: Diagnosis not present

## 2016-07-13 DIAGNOSIS — G20 Parkinson's disease: Secondary | ICD-10-CM | POA: Diagnosis not present

## 2016-07-14 DIAGNOSIS — G20 Parkinson's disease: Secondary | ICD-10-CM | POA: Diagnosis not present

## 2016-07-15 DIAGNOSIS — G20 Parkinson's disease: Secondary | ICD-10-CM | POA: Diagnosis not present

## 2016-07-16 DIAGNOSIS — G20 Parkinson's disease: Secondary | ICD-10-CM | POA: Diagnosis not present

## 2016-07-17 DIAGNOSIS — G20 Parkinson's disease: Secondary | ICD-10-CM | POA: Diagnosis not present

## 2016-07-18 DIAGNOSIS — G20 Parkinson's disease: Secondary | ICD-10-CM | POA: Diagnosis not present

## 2016-07-19 DIAGNOSIS — G20 Parkinson's disease: Secondary | ICD-10-CM | POA: Diagnosis not present

## 2016-07-20 DIAGNOSIS — G20 Parkinson's disease: Secondary | ICD-10-CM | POA: Diagnosis not present

## 2016-07-21 DIAGNOSIS — G20 Parkinson's disease: Secondary | ICD-10-CM | POA: Diagnosis not present

## 2016-07-22 DIAGNOSIS — G20 Parkinson's disease: Secondary | ICD-10-CM | POA: Diagnosis not present

## 2016-07-23 DIAGNOSIS — G20 Parkinson's disease: Secondary | ICD-10-CM | POA: Diagnosis not present

## 2016-07-24 DIAGNOSIS — G20 Parkinson's disease: Secondary | ICD-10-CM | POA: Diagnosis not present

## 2016-07-25 DIAGNOSIS — G20 Parkinson's disease: Secondary | ICD-10-CM | POA: Diagnosis not present

## 2016-07-26 DIAGNOSIS — G20 Parkinson's disease: Secondary | ICD-10-CM | POA: Diagnosis not present

## 2016-07-27 DIAGNOSIS — G20 Parkinson's disease: Secondary | ICD-10-CM | POA: Diagnosis not present

## 2016-07-28 DIAGNOSIS — G20 Parkinson's disease: Secondary | ICD-10-CM | POA: Diagnosis not present

## 2016-07-29 DIAGNOSIS — G20 Parkinson's disease: Secondary | ICD-10-CM | POA: Diagnosis not present

## 2016-07-30 DIAGNOSIS — G20 Parkinson's disease: Secondary | ICD-10-CM | POA: Diagnosis not present

## 2016-07-31 DIAGNOSIS — G20 Parkinson's disease: Secondary | ICD-10-CM | POA: Diagnosis not present

## 2016-08-01 DIAGNOSIS — G20 Parkinson's disease: Secondary | ICD-10-CM | POA: Diagnosis not present

## 2016-08-02 DIAGNOSIS — G20 Parkinson's disease: Secondary | ICD-10-CM | POA: Diagnosis not present

## 2016-08-03 DIAGNOSIS — G20 Parkinson's disease: Secondary | ICD-10-CM | POA: Diagnosis not present

## 2016-08-04 DIAGNOSIS — G20 Parkinson's disease: Secondary | ICD-10-CM | POA: Diagnosis not present

## 2016-08-05 DIAGNOSIS — G20 Parkinson's disease: Secondary | ICD-10-CM | POA: Diagnosis not present

## 2016-08-06 DIAGNOSIS — G20 Parkinson's disease: Secondary | ICD-10-CM | POA: Diagnosis not present

## 2016-08-07 DIAGNOSIS — G20 Parkinson's disease: Secondary | ICD-10-CM | POA: Diagnosis not present

## 2016-08-08 DIAGNOSIS — G20 Parkinson's disease: Secondary | ICD-10-CM | POA: Diagnosis not present

## 2016-08-09 DIAGNOSIS — G20 Parkinson's disease: Secondary | ICD-10-CM | POA: Diagnosis not present

## 2016-08-10 DIAGNOSIS — G20 Parkinson's disease: Secondary | ICD-10-CM | POA: Diagnosis not present

## 2016-08-11 DIAGNOSIS — G20 Parkinson's disease: Secondary | ICD-10-CM | POA: Diagnosis not present

## 2016-08-12 DIAGNOSIS — G20 Parkinson's disease: Secondary | ICD-10-CM | POA: Diagnosis not present

## 2016-08-13 DIAGNOSIS — G20 Parkinson's disease: Secondary | ICD-10-CM | POA: Diagnosis not present

## 2016-08-14 DIAGNOSIS — G20 Parkinson's disease: Secondary | ICD-10-CM | POA: Diagnosis not present

## 2016-08-15 DIAGNOSIS — G20 Parkinson's disease: Secondary | ICD-10-CM | POA: Diagnosis not present

## 2016-08-16 DIAGNOSIS — G20 Parkinson's disease: Secondary | ICD-10-CM | POA: Diagnosis not present

## 2016-08-17 DIAGNOSIS — G20 Parkinson's disease: Secondary | ICD-10-CM | POA: Diagnosis not present

## 2016-08-18 DIAGNOSIS — G20 Parkinson's disease: Secondary | ICD-10-CM | POA: Diagnosis not present

## 2016-08-19 DIAGNOSIS — G20 Parkinson's disease: Secondary | ICD-10-CM | POA: Diagnosis not present

## 2016-08-20 DIAGNOSIS — G20 Parkinson's disease: Secondary | ICD-10-CM | POA: Diagnosis not present

## 2016-08-21 DIAGNOSIS — G20 Parkinson's disease: Secondary | ICD-10-CM | POA: Diagnosis not present

## 2016-08-22 DIAGNOSIS — G20 Parkinson's disease: Secondary | ICD-10-CM | POA: Diagnosis not present

## 2016-08-23 DIAGNOSIS — G20 Parkinson's disease: Secondary | ICD-10-CM | POA: Diagnosis not present

## 2016-08-24 DIAGNOSIS — G20 Parkinson's disease: Secondary | ICD-10-CM | POA: Diagnosis not present

## 2016-08-25 DIAGNOSIS — G20 Parkinson's disease: Secondary | ICD-10-CM | POA: Diagnosis not present

## 2016-08-26 DIAGNOSIS — G20 Parkinson's disease: Secondary | ICD-10-CM | POA: Diagnosis not present

## 2016-08-27 DIAGNOSIS — G20 Parkinson's disease: Secondary | ICD-10-CM | POA: Diagnosis not present

## 2016-08-28 DIAGNOSIS — G20 Parkinson's disease: Secondary | ICD-10-CM | POA: Diagnosis not present

## 2016-08-29 DIAGNOSIS — G20 Parkinson's disease: Secondary | ICD-10-CM | POA: Diagnosis not present

## 2016-08-30 DIAGNOSIS — G20 Parkinson's disease: Secondary | ICD-10-CM | POA: Diagnosis not present

## 2016-08-31 DIAGNOSIS — G20 Parkinson's disease: Secondary | ICD-10-CM | POA: Diagnosis not present

## 2016-09-01 DIAGNOSIS — G20 Parkinson's disease: Secondary | ICD-10-CM | POA: Diagnosis not present

## 2016-09-02 DIAGNOSIS — G20 Parkinson's disease: Secondary | ICD-10-CM | POA: Diagnosis not present

## 2016-09-03 DIAGNOSIS — G20 Parkinson's disease: Secondary | ICD-10-CM | POA: Diagnosis not present

## 2016-09-04 DIAGNOSIS — G20 Parkinson's disease: Secondary | ICD-10-CM | POA: Diagnosis not present

## 2016-09-05 DIAGNOSIS — G20 Parkinson's disease: Secondary | ICD-10-CM | POA: Diagnosis not present

## 2016-09-06 DIAGNOSIS — G20 Parkinson's disease: Secondary | ICD-10-CM | POA: Diagnosis not present

## 2016-09-07 DIAGNOSIS — G20 Parkinson's disease: Secondary | ICD-10-CM | POA: Diagnosis not present

## 2016-09-08 DIAGNOSIS — R159 Full incontinence of feces: Secondary | ICD-10-CM | POA: Diagnosis not present

## 2016-09-08 DIAGNOSIS — G20 Parkinson's disease: Secondary | ICD-10-CM | POA: Diagnosis not present

## 2016-09-08 DIAGNOSIS — R32 Unspecified urinary incontinence: Secondary | ICD-10-CM | POA: Diagnosis not present

## 2016-09-09 DIAGNOSIS — G20 Parkinson's disease: Secondary | ICD-10-CM | POA: Diagnosis not present

## 2016-09-10 DIAGNOSIS — G20 Parkinson's disease: Secondary | ICD-10-CM | POA: Diagnosis not present

## 2016-09-11 DIAGNOSIS — G20 Parkinson's disease: Secondary | ICD-10-CM | POA: Diagnosis not present

## 2016-09-12 DIAGNOSIS — G20 Parkinson's disease: Secondary | ICD-10-CM | POA: Diagnosis not present

## 2016-09-15 DIAGNOSIS — R6889 Other general symptoms and signs: Secondary | ICD-10-CM | POA: Diagnosis not present

## 2016-09-15 DIAGNOSIS — Z7401 Bed confinement status: Secondary | ICD-10-CM | POA: Diagnosis not present

## 2016-10-14 DEATH — deceased
